# Patient Record
Sex: Female | Born: 1950
Health system: Southern US, Community
[De-identification: ages and names within clinical notes are randomized; demographics above are authoritative.]

## PROBLEM LIST (undated history)

## (undated) DIAGNOSIS — K5790 Diverticulosis of intestine, part unspecified, without perforation or abscess without bleeding: Secondary | ICD-10-CM

## (undated) DIAGNOSIS — T8859XA Other complications of anesthesia, initial encounter: Secondary | ICD-10-CM

## (undated) DIAGNOSIS — M1990 Unspecified osteoarthritis, unspecified site: Secondary | ICD-10-CM

## (undated) DIAGNOSIS — F32A Depression, unspecified: Secondary | ICD-10-CM

## (undated) DIAGNOSIS — E079 Disorder of thyroid, unspecified: Secondary | ICD-10-CM

## (undated) DIAGNOSIS — I10 Essential (primary) hypertension: Secondary | ICD-10-CM

## (undated) DIAGNOSIS — R7303 Prediabetes: Secondary | ICD-10-CM

## (undated) DIAGNOSIS — E039 Hypothyroidism, unspecified: Secondary | ICD-10-CM

## (undated) HISTORY — PX: KNEE ARTHROSCOPY: SUR90

## (undated) HISTORY — PX: TONSILLECTOMY: SUR1361

## (undated) HISTORY — PX: ABDOMINAL HYSTERECTOMY: SHX81

## (undated) HISTORY — PX: SHOULDER ARTHROSCOPY: SHX128

## (undated) HISTORY — PX: CYST EXCISION: SHX5701

---

## 1998-03-18 ENCOUNTER — Ambulatory Visit (HOSPITAL_COMMUNITY): Admission: RE | Admit: 1998-03-18 | Discharge: 1998-03-18 | Payer: Self-pay | Admitting: Obstetrics and Gynecology

## 1998-10-15 ENCOUNTER — Encounter: Admission: RE | Admit: 1998-10-15 | Discharge: 1998-11-04 | Payer: Self-pay | Admitting: Family Medicine

## 2000-06-01 ENCOUNTER — Encounter: Payer: Self-pay | Admitting: Obstetrics and Gynecology

## 2000-06-01 ENCOUNTER — Ambulatory Visit (HOSPITAL_COMMUNITY): Admission: RE | Admit: 2000-06-01 | Discharge: 2000-06-01 | Payer: Self-pay | Admitting: Obstetrics and Gynecology

## 2000-06-07 ENCOUNTER — Encounter: Admission: RE | Admit: 2000-06-07 | Discharge: 2000-06-07 | Payer: Self-pay | Admitting: Obstetrics and Gynecology

## 2000-06-07 ENCOUNTER — Encounter: Payer: Self-pay | Admitting: Obstetrics and Gynecology

## 2012-01-06 ENCOUNTER — Emergency Department (HOSPITAL_BASED_OUTPATIENT_CLINIC_OR_DEPARTMENT_OTHER)
Admission: EM | Admit: 2012-01-06 | Discharge: 2012-01-06 | Disposition: A | Discharge status: Home / Self Care | Payer: Self-pay | Attending: Emergency Medicine | Admitting: Emergency Medicine

## 2012-01-06 ENCOUNTER — Encounter (HOSPITAL_BASED_OUTPATIENT_CLINIC_OR_DEPARTMENT_OTHER): Payer: Self-pay | Admitting: *Deleted

## 2012-01-06 DIAGNOSIS — G5603 Carpal tunnel syndrome, bilateral upper limbs: Secondary | ICD-10-CM

## 2012-01-06 DIAGNOSIS — R209 Unspecified disturbances of skin sensation: Secondary | ICD-10-CM | POA: Insufficient documentation

## 2012-01-06 DIAGNOSIS — G5600 Carpal tunnel syndrome, unspecified upper limb: Secondary | ICD-10-CM | POA: Insufficient documentation

## 2012-01-06 DIAGNOSIS — M79609 Pain in unspecified limb: Secondary | ICD-10-CM | POA: Insufficient documentation

## 2012-01-06 HISTORY — DX: Essential (primary) hypertension: I10

## 2012-01-06 HISTORY — DX: Diverticulosis of intestine, part unspecified, without perforation or abscess without bleeding: K57.90

## 2012-01-06 HISTORY — DX: Disorder of thyroid, unspecified: E07.9

## 2012-01-06 MED ORDER — OXYCODONE-ACETAMINOPHEN 5-325 MG PO TABS: 2.0000 | ORAL_TABLET | ORAL | Status: AC | PRN

## 2012-01-06 NOTE — ED Notes (Signed)
Bilateral wrist splints applied.  Patient verbalized understanding of use, application and removal of the splints.

## 2012-01-06 NOTE — ED Notes (Signed)
Patient states she has bilateral hand pain and numbness for many years.  States her symptoms improved after being layed off from her job.  Has recently returned to work doing same type of job and for the last three days her hands have become more painful and numb, which is now waking her up at night.  Using ibuprofen with minimal relief.

## 2012-01-06 NOTE — Discharge Instructions (Signed)
Carpal Tunnel Syndrome Fact Sheet    You are working at your desk, trying to ignore the tingling or numbness you have had for months in your hand and wrist. Suddenly, a sharp, piercing pain shoots through the wrist and up your arm. Just a passing cramp? More likely you have carpal tunnel syndrome. This is a painful progressive condition caused by compression of a key nerve in the wrist.  WHAT IS CARPAL TUNNEL SYNDROME?  Carpal tunnel syndrome occurs when the nerve that runs from the forearm into the hand (median nerve), becomes pressed or squeezed at the wrist. The median nerve controls sensations to the palm side of the thumb and fingers (although not the little finger). That nerve also controls impulses to some small muscles in the hand, that allow the fingers and thumb to move. The narrow, rigid passageway of ligament and bones at the base of the hand (carpal tunnel) houses the median nerve and tendons. Sometimes, thickening from irritated tendons or other swelling narrows the tunnel. The narrowing causes the median nerve to be compressed. The result may be pain, weakness, or numbness in the hand and wrist that moves (radiates) up the arm. Although painful sensations may indicate other conditions, carpal tunnel syndrome is the most common and widely known of the entrapment neuropathies. These are conditions in which the body's peripheral nerves are compressed or traumatized.  SYMPTOMS   · Symptoms usually start gradually, with:   · Frequent burning.   · Tingling.   · Itching numbness in the palm of the hand and the fingers.   This is especially true in the thumb and the index and middle fingers.  · Some carpal tunnel sufferers say their fingers feel useless and swollen. They may feel this way even though little or no swelling is apparent.   · The symptoms often first appear in one or both hands during the night, since many people sleep with flexed wrists. A person with carpal tunnel syndrome may wake up feeling  the need to shake out the hand or wrist.   · As symptoms worsen, people might feel tingling during the day.   · Decreased grip strength may make it difficult to:   · Form a fist.   · Grasp small objects.   · Perform other manual tasks.   In chronic and/or untreated cases, the muscles at the base of the thumb may waste away.  · Some people are unable to tell between hot and cold by touch.   CAUSES   · Carpal tunnel syndrome is often the result of a combination of factors that increase pressure on the median nerve and tendons in the carpal tunnel. It is usually not a problem with the nerve itself.   · Most likely the disorder is due to a congenital predisposition. This means that the carpal tunnel is simply smaller in some people than in others.   · Other factors include:   · Trauma or injury to the wrist that cause swelling, such as sprain or fracture.   · Overactivity of the pituitary gland.   · Hypothyroidism.   · Rheumatoid arthritis.   · Mechanical problems in the wrist joint.   · Work stress.   · Repeated use of vibrating hand tools.   · Fluid retention during pregnancy or menopause.   · The development of a cyst or tumor in the canal.   · In some cases, no cause can be identified.   · There is little clinical   data to prove whether repetitive and forceful movements of the hand and wrist during work or leisure activities can cause carpal tunnel syndrome. Repeated motions performed in the course of normal work or other daily activities can result in repetitive motion disorders such as bursitis and tendonitis. Writer's cramp, a condition in which a lack of fine motor skill coordination and ache and pressure in the fingers, wrist, or forearm is brought on by repetitive activity, is not a symptom of carpal tunnel syndrome.   WHO IS AT RISK OF DEVELOPING CARPAL TUNNEL SYNDROME?  · Women are three times more likely than men to develop carpal tunnel syndrome. This may be the result of the carpal tunnel being smaller in  women.   · The dominant hand is usually affected first and produces the most severe pain.   · Diabetes or other metabolic disorders that directly affect the body's nerves make people more susceptible to compression.   · Carpal tunnel syndrome usually occurs only in adults.   · The risk of developing carpal tunnel syndrome is not confined to people in a single industry or job. However, it is especially common in those performing assembly line work, such as:   · Manufacturing.   · Sewing.   · Finishing.   · Cleaning.   · Meat packing.   Carpal tunnel syndrome is three times more common among assemblers than among data-entry personnel. A 2001 study by the [facility] found heavy computer use (up to 7 hours a day) did not increase a person's risk of developing carpal tunnel syndrome.  · During 1998, an estimated three of every 10,000 workers lost time from work because of carpal tunnel syndrome. Half of these workers missed more than 10 days of work. The average lifetime cost of carpal tunnel syndrome is estimated to be about $30,000 for each injured worker. This includes medical bills and lost time from work   DIAGNOSIS   · Early diagnosis and treatment are important to avoid permanent damage to the median nerve.   · A physical examination of the hands, arms, shoulders, and neck can help determine if the patient's complaints are related to daily activities or to an underlying disorder. An exam can rule out other painful conditions that mimic carpal tunnel syndrome.   · The wrist is examined for:   · Tenderness.   · Swelling.   · Warmth.   · Discoloration.   · Each finger should be tested for sensation. The muscles at the base of the hand should be examined for strength and signs of shrinking (atrophy).   · Routine laboratory tests and X-rays can reveal:   · Diabetes.   · Arthritis.   · Fractures.   · Physicians can use specific tests to try to produce the symptoms of carpal tunnel syndrome.   · In the Tinel test, the  caregiver taps or presses on the median nerve in the patient's wrist. The test is positive when tingling in the fingers or a shock-like sensation occurs.   · The Phalen or wrist-flexion test involves having the patient hold their forearms upright by pointing the fingers down and pressing the backs of the hands together. The presence of carpal tunnel syndrome is suggested if one or more symptoms is felt in the fingers within 1 minute.   · Caregivers may also ask patients to try to make a movement that brings on symptoms.   · Often it is necessary to confirm the diagnosis by use of   electrodiagnostic tests.   · In a nerve conduction study, electrodes are placed on the hand and wrist. Small electric shocks are applied and the speed with which nerves transmit impulses is measured.   · In electromyography, a fine needle is inserted into a muscle. The electrical activity is viewed on a screen. The activity can determine the severity of damage to the median nerve.   · Ultrasound imaging can show impaired movement of the median nerve.   TREATMENT   Treatments for carpal tunnel syndrome should begin as early as possible. It should be done under a caregiver's direction. Underlying causes such as diabetes or arthritis should be treated first. Initial treatment generally involves:  · Resting the affected hand and wrist for at least 2 weeks.   · Avoiding activities that may worsen symptoms.   · Immobilizing (keeping from moving) the wrist in a splint.   These things are all done to avoid further damage from twisting or bending. If there is inflammation, applying cool packs can help reduce swelling.  NON-SURGICAL  · Drugs - In special circumstances, different drugs can ease the pain and swelling.   · Nonsteroidal anti-inflammatory drugs, such as aspirin, and other non-prescription pain relievers, may ease symptoms.   · Orally administered diuretics (water pills) can decrease swelling.   · Corticosteroids such as prednisone or  lidocaine can be injected directly into the wrist or taken by mouth. This can relieve pressure on the median nerve. It may provide immediate, but temporary relief to persons with mild or intermittent symptoms. (Caution: persons with diabetes and those who may be predisposed to diabetes should note that long term use of corticosteroids can make it difficult to regulate insulin levels. Corticosterioids should not be taken without a prescription.)   · Additionally, some studies show that vitamin B6 (pyridoxine) supplements may ease the symptoms of carpal tunnel syndrome.   · Exercise - Stretching and strengthening exercises can be helpful in people whose symptoms have decreased. These exercises may be supervised by a physical therapist that is trained to use exercises to treat physical impairments. The exercises can also be supervised by an occupational therapist. This is someone who is trained in evaluating people with physical impairments. They will help them build skills to improve their health and well-being.   · Alternative therapies - Acupuncture and chiropractic care have helped some patients. Their effectiveness remains unproved. An exception is yoga, which has been shown to reduce pain and improve grip strength among patients with carpal tunnel syndrome.   SURGERY  Carpal tunnel release is a surgical procedure commonly used. It is generally recommended if symptoms last for 6 months or more. This surgery involves cutting the band of tissue around the wrist to reduce pressure on the median nerve. Surgery is done under local anesthesia. It does not require an overnight hospital stay. Many patients require surgery on both hands. The following are types of carpal tunnel release surgery:  · Open release surgery. This is the traditional procedure used to correct carpal tunnel syndrome. It consists of making a cut (incision) up to 2 inches in the wrist. Then, the surgeon cuts the carpal ligament to enlarge the carpal  tunnel. The procedure is generally done under local anesthesia on an outpatient basis. The only exception is if there are unusual medical considerations.   · Endoscopic surgery may allow faster recovery and less postoperative discomfort than traditional open release surgery. The surgeon makes two incisions (about 1/2" each) in the wrist and   palm. The surgeon will:   · Insert a camera attached to a tube.   · Observe the tissue on a screen.   · Cut the carpal ligament (the tissue that holds joints together).   This two-portal endoscopic surgery, generally performed under local anesthesia, is effective and minimizes scarring and scar tenderness. One-portal endoscopic surgery for carpal tunnel syndrome is also available.  Although symptoms may be relieved immediately after surgery, full recovery from carpal tunnel surgery can take months. Some patients may have:  · Infection.   · Nerve damage.   · Stiffness.   · Pain at the scar.   Occasionally, the wrist loses strength because the carpal ligament is cut. Patients should undergo physical therapy after surgery to restore wrist strength. Some patients may need to adjust job duties or even change jobs after recovery from surgery.  Recurrence of carpal tunnel syndrome following treatment is rare. The majority of patients recover completely.  PREVENTION   At the workplace, workers can:   · Do on-the-job conditioning, perform stretching exercises.   · Take frequent rest breaks.   · Wear splints to keep wrists straight.   · Use correct posture and wrist position.   · Wearing finger-less gloves can help keep hands warm and flexible.   · Design workstations, tools and tool handles, and tasks to enable the worker's wrist to maintain a natural position.   · Rotate jobs among workers.   · Employers can develop programs in ergonomics. This is the process of adapting workplace conditions and job demands to the capabilities of workers. However, research has not conclusively shown that  these workplace changes prevent the occurrence of carpal tunnel syndrome.   WHAT RESEARCH IS BEING DONE?  The National Institute of Neurological Disorders and Stroke (NINDS), a part of the National Institutes of Health, is the federal government's leading supporter of research on carpal tunnel syndrome. Scientists are studying the order of events that occur with carpal tunnel syndrome. They do this to better understand, treat, and prevent this ailment.   WHAT IS PERCUTANEOUS BALLOON CARPAL TUNNEL-PLASTY?  Percutaneous balloon carpal tunnel-plasty is an experimental technique that can ease carpal tunnel pain without cutting the carpal ligament. In this procedure, a 1/4 -inch cut is made at the base of the palm. The caregiver then inserts a balloon through a catheter under the carpal ligament and inflates the balloon to stretch the ligament and free the nerve. Patients in one small study of this procedure reported relief of symptoms with no complications after the surgery. Most of them were back to work within 2 weeks. This experimental technique is not yet widely available.  FOR MORE INFORMATION  American Academy of Orthopaedic Surgeons: www.aaos.org   Centers for Disease Control and Prevention (CDCP): www.cdc.gov  National Institute of Arthritis and Musculoskeletal and Skin Diseases (NIAMS): www.niams.nih.gov  American Chronic Pain Association (ACPA): www.theacpa.org  National Chronic Pain Outreach Association (NCPOA): www.chronicpain.org  Occupational Safety & Health Administration: www.osha.gov  Document Released: 06/01/2004 Document Revised: 06/30/2011 Document Reviewed: 06/05/2008  [employer]® Patient Information ©2012 [employer].

## 2012-01-06 NOTE — ED Provider Notes (Signed)
History     CSN: [id number]  Arrival date & time 01/06/12  1036   First MD Initiated Contact with Patient 01/06/12 1057      Chief Complaint  Patient presents with  . Hand Pain    bilateral hand numbness    (Consider location/radiation/quality/duration/timing/severity/associated sxs/prior treatment) HPI Patient with bilateral hand pain for four years.  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  Pain in bilateral hands with some tingling to left third finger.  Pain awakens at night and patient shakes hands to relieve.  No injury, redness, swelling, proximal adenopathy, or neck pain.   No past medical history on file.  No past surgical history on file.  No family history on file.  History  Substance Use Topics  . Smoking status: Not on file  . Smokeless tobacco: Not on file  . Alcohol Use: Not on file    OB History    No data available      Review of Systems  All other systems reviewed and are negative.    Allergies  Review of patient's allergies indicates not on file.  Home Medications  No current outpatient prescriptions on file.  BP 134/88  Pulse 82  Temp(Src) 98.1 F (36.7 C) (Oral)  Ht 5\' 5"  (1.651 m)  Wt 180 lb (81.647 kg)  BMI 29.95 kg/m2  SpO2 99%  Physical Exam  Vitals reviewed. Constitutional: She is oriented to person, place, and time. She appears well-developed and well-nourished.  HENT:  Head: Normocephalic and atraumatic.  Eyes: Conjunctivae are normal. Pupils are equal, round, and reactive to light.  Neck: Normal range of motion. Neck supple.  Cardiovascular: Normal rate and regular rhythm.   Pulmonary/Chest: Effort normal.  Abdominal: Soft.  Musculoskeletal:       Right wrist: She exhibits normal range of motion, no tenderness, no bony tenderness, no swelling, no effusion, no crepitus, no deformity and no laceration.       Left wrist: She exhibits normal range of motion, no tenderness, no bony  tenderness, no swelling, no effusion, no crepitus, no deformity and no laceration.       Bilateral tinel's test positive, left phalen maneuver positive  Neurological: She is alert and oriented to person, place, and time. She has normal reflexes.  Skin: Skin is warm and dry.  Psychiatric: She has a normal mood and affect.    ED Course  Procedures (including critical care time)  Labs Reviewed - No data to display No results found.   No diagnosis found.    MDM    Patient symptoms consistent with carpal tunnel syndrome. Plan splints and followup.    Hilario Quarry, MD 01/06/12 1118

## 2012-01-18 ENCOUNTER — Encounter: Payer: Self-pay | Admitting: Family Medicine

## 2012-01-18 ENCOUNTER — Ambulatory Visit (INDEPENDENT_AMBULATORY_CARE_PROVIDER_SITE_OTHER): Payer: Self-pay | Admitting: Family Medicine

## 2012-01-18 VITALS — BP 158/80 | HR 81 | Temp 98.1°F | Ht 66.0 in | Wt 180.0 lb

## 2012-01-18 DIAGNOSIS — G5600 Carpal tunnel syndrome, unspecified upper limb: Secondary | ICD-10-CM

## 2012-01-18 DIAGNOSIS — G5603 Carpal tunnel syndrome, bilateral upper limbs: Secondary | ICD-10-CM

## 2012-01-18 NOTE — Patient Instructions (Signed)
You have carpal tunnel syndrome. Wear the wrist splint at nighttime and as often as possible during the day Take ibuprofen (3 tablets three times a day with food) OR aleve (2 tablets twice a day with food) for pain and inflammation. Corticosteroid injection is a consideration to help with pain and inflammation - you were given this today. If not improving, will consider nerve conduction studies to assess severity. Surgery tends to work well for this if you do not improve with conservative care. Expect pain to go away first, then weakness, then numbness. You can repeat the injection again if you only get some relief. Follow up with me in 1 month for a recheck.

## 2012-01-19 ENCOUNTER — Encounter: Payer: Self-pay | Admitting: Family Medicine

## 2012-01-19 DIAGNOSIS — G5603 Carpal tunnel syndrome, bilateral upper limbs: Secondary | ICD-10-CM | POA: Insufficient documentation

## 2012-01-19 NOTE — Progress Notes (Signed)
Subjective:    Patient ID: Monica Mcintyre, female    DOB: [birth year], 61 y.o.   MRN: [id number]  PCP: None  HPI 61 yo F here with L > R wrist pain.  Patient denies known injury. 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 Unable to sleep due to pain. 1st-3rd digits on left hand numb. Also with cramps in to L > R hands She is right handed. Using wrist braces at nighttime which do help but still having a lot of pain, numbness left hand.  Past Medical History  Diagnosis Date  . Diverticulosis   . Hypertension   . Thyroid disease     Current Outpatient Prescriptions on File Prior to Visit  Medication Sig Dispense Refill  . ibuprofen (ADVIL,MOTRIN) 600 MG tablet Take 600 mg by mouth every 8 (eight) hours as needed.      Marland Kitchen levothyroxine (SYNTHROID, LEVOTHROID) 75 MCG tablet Take 75 mcg by mouth daily.        Past Surgical History  Procedure Date  . Knee arthroscopy   . Abdominal hysterectomy   . Tonsillectomy     Allergies  Allergen Reactions  . Codeine Nausea And Vomiting    History   Social History  . Marital Status: Unknown    Spouse Name: N/A    Number of Children: N/A  . Years of Education: N/A   Occupational History  . Not on file.   Social History Main Topics  . Smoking status: Never Smoker   . Smokeless tobacco: Not on file  . Alcohol Use: No  . Drug Use: No  . Sexually Active: Not on file   Other Topics Concern  . Not on file   Social History Narrative  . No narrative on file    Family History  Problem Relation Age of Onset  . Hyperlipidemia Mother   . Hypertension Mother   . Diabetes Neg Hx   . Heart attack Neg Hx   . Sudden death Neg Hx     BP 158/80  Pulse 81  Temp(Src) 98.1 F (36.7 C) (Oral)  Ht 5\' 6"   (1.676 m)  Wt 180 lb (81.647 kg)  BMI 29.05 kg/m2  Review of Systems See HPI above.    Objective:   Physical Exam Gen: NAD  Left wrist: No gross deformity, swelling, bruising.  No thenar/hypothenar atrophy. TTP volar wrist over carpal tunnel.  No other TTP about wrist, hand. FROM wrist and digits. Strength 5/5 with finger abduction, thumb opposition, finger extension. + tinels and phalens. Sensation diminished 1st-3rd digits. Cap refill < 3 sec all digits.  Right wrist: No gross deformity, swelling, bruising.  No thenar/hypothenar atrophy. Minimal TTP volar wrist over carpal tunnel.  No other TTP about wrist, hand. FROM wrist and digits. Strength 5/5 with finger abduction, thumb opposition, finger extension. Mild + tinels, negative phalens. Sensation intact all digits. Cap refill < 3 sec all digits.    Assessment & Plan:  1. Bilateral carpal tunnel - left side very symptomatic - she would like to go ahead with carpal tunnel injection today.  Continue with cockup wrist splints bilaterally.  Discussed nsaids, avoiding painful activities.  Consider repeat injection vs NCV/EMG if not improving as expected.  See instructions for further.  After informed written consent patient  was seated on exam table.  Left wrist prepped with alcohol swab after U/s identified median nerve (0.22cm volume) - prepped ulnar to this.  Carpal tunnel then injected with 2:1 marcaine: depomedrol.  Patient tolerated procedure well without any immediate complications.

## 2012-01-19 NOTE — Assessment & Plan Note (Signed)
left side very symptomatic - she would like to go ahead with carpal tunnel injection today.  Continue with cockup wrist splints bilaterally.  Discussed nsaids, avoiding painful activities.  Consider repeat injection vs NCV/EMG if not improving as expected.  See instructions for further.  After informed written consent patient was seated on exam table.  Left wrist prepped with alcohol swab after U/s identified median nerve (0.22cm volume) - prepped ulnar to this.  Carpal tunnel then injected with 2:1 marcaine: depomedrol.  Patient tolerated procedure well without any immediate complications.

## 2012-02-21 ENCOUNTER — Ambulatory Visit (INDEPENDENT_AMBULATORY_CARE_PROVIDER_SITE_OTHER): Payer: Self-pay | Admitting: Family Medicine

## 2012-02-21 ENCOUNTER — Encounter: Payer: Self-pay | Admitting: Family Medicine

## 2012-02-21 VITALS — BP 144/82 | HR 73 | Temp 97.7°F | Ht 65.0 in | Wt 180.0 lb

## 2012-02-21 DIAGNOSIS — G5600 Carpal tunnel syndrome, unspecified upper limb: Secondary | ICD-10-CM

## 2012-02-21 DIAGNOSIS — G5603 Carpal tunnel syndrome, bilateral upper limbs: Secondary | ICD-10-CM

## 2012-02-21 DIAGNOSIS — G5602 Carpal tunnel syndrome, left upper limb: Secondary | ICD-10-CM

## 2012-02-21 NOTE — Patient Instructions (Signed)
We will go ahead with nerve conduction velocities of your left arm for carpal tunnel and I will call you when I get these results. Continue wearing the wrist braces at night and as much as you can during the day. Avoid icing these areas as it can numb the nerves more. Take vitamin B6 100mg  daily - this may help with nerve pain and healing.

## 2012-02-22 ENCOUNTER — Encounter: Payer: Self-pay | Admitting: Family Medicine

## 2012-02-22 NOTE — Progress Notes (Signed)
Subjective:    Patient ID: Monica Mcintyre, female    DOB: 08-10-1951, 61 y.o.   MRN: [id number]  PCP: None  HPI  61 yo F here for f/u L > R wrist pain.  3/19: Patient denies known injury. 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 Unable to sleep due to pain. 1st-3rd digits on left hand numb. Also with cramps in to L > R hands She is right handed. Using wrist braces at nighttime which do help but still having a lot of pain, numbness left hand.  4/22: Patient reports she's had only mild improvement since last visit. Right wrist and hand not really bothering her (only once since last visit per her report). But left hand/wrist still very symptomatic. Doesn't feel she has improvement with cortisone injection. Left hand feels more weak, hot water bothers it. Worse during the day. Compliant with nighttime splinting.  Past Medical History  Diagnosis Date  . Diverticulosis   . Hypertension   . Thyroid disease     Current Outpatient Prescriptions on File Prior to Visit  Medication Sig Dispense Refill  . ibuprofen (ADVIL,MOTRIN) 600 MG tablet Take 600 mg by mouth every 8 (eight) hours as needed.      Marland Kitchen levothyroxine (SYNTHROID, LEVOTHROID) 75 MCG tablet Take 75 mcg by mouth daily.        Past Surgical History  Procedure Date  . Knee arthroscopy   . Abdominal hysterectomy   . Tonsillectomy     Allergies  Allergen Reactions  . Codeine Nausea And Vomiting    History   Social History  . Marital Status: Unknown    Spouse Name: N/A    Number of Children: N/A  . Years of Education: N/A   Occupational History  . Not on file.   Social History Main Topics  . Smoking status: Never Smoker   . Smokeless tobacco: Not on file  . Alcohol Use: No    . Drug Use: No  . Sexually Active: Not on file   Other Topics Concern  . Not on file   Social History Narrative  . No narrative on file    Family History  Problem Relation Age of Onset  . Hyperlipidemia Mother   . Hypertension Mother   . Diabetes Neg Hx   . Heart attack Neg Hx   . Sudden death Neg Hx     BP 144/82  Pulse 73  Temp(Src) 97.7 F (36.5 C) (Oral)  Ht 5\' 5"  (1.651 m)  Wt 180 lb (81.647 kg)  BMI 29.95 kg/m2  Review of Systems  See HPI above.    Objective:   Physical Exam  Gen: NAD  Left wrist: No gross deformity, swelling, bruising.  No thenar/hypothenar atrophy. Mild TTP volar wrist over carpal tunnel.  No other TTP about wrist, hand. FROM wrist and digits. Strength 5-/5 with finger abduction, thumb opposition. 5/5 finger extension. + tinels and phalens. Sensation diminished 1st-3rd digits. Cap refill < 3 sec all digits.  Right wrist: No gross deformity, swelling, bruising.  No thenar/hypothenar atrophy. No TTP volar wrist over carpal tunnel.  No other TTP about wrist, hand. FROM wrist and digits. Strength 5/5 with finger abduction, thumb opposition, finger extension. Negative tinels, negative phalens. Sensation intact all digits. Cap refill < 3  sec all digits.    Assessment & Plan:  1. Bilateral carpal tunnel - left side very symptomatic - not improved with cortisone injection and splints.  Will go ahead with NCV/EMG of left upper extremity to further assess.  Right side is improved for the most part - continue with splinting.  Will call her with results of NCV/EMG and how to proceed after this.

## 2012-02-22 NOTE — Assessment & Plan Note (Signed)
left side very symptomatic - not improved with cortisone injection and splints.  Will go ahead with NCV/EMG of left upper extremity to further assess.  Right side is improved for the most part - continue with splinting.  Will call her with results of NCV/EMG and how to proceed after this.

## 2018-02-27 DIAGNOSIS — M1990 Unspecified osteoarthritis, unspecified site: Secondary | ICD-10-CM | POA: Diagnosis not present

## 2018-02-27 DIAGNOSIS — E039 Hypothyroidism, unspecified: Secondary | ICD-10-CM | POA: Diagnosis not present

## 2018-02-27 DIAGNOSIS — I10 Essential (primary) hypertension: Secondary | ICD-10-CM | POA: Diagnosis not present

## 2018-02-27 DIAGNOSIS — J309 Allergic rhinitis, unspecified: Secondary | ICD-10-CM | POA: Diagnosis not present

## 2018-02-27 DIAGNOSIS — E559 Vitamin D deficiency, unspecified: Secondary | ICD-10-CM | POA: Diagnosis not present

## 2018-02-27 DIAGNOSIS — R739 Hyperglycemia, unspecified: Secondary | ICD-10-CM | POA: Diagnosis not present

## 2018-03-06 ENCOUNTER — Other Ambulatory Visit: Payer: Self-pay | Admitting: Internal Medicine

## 2018-03-06 DIAGNOSIS — Z6379 Other stressful life events affecting family and household: Secondary | ICD-10-CM | POA: Diagnosis not present

## 2018-03-06 DIAGNOSIS — H539 Unspecified visual disturbance: Secondary | ICD-10-CM | POA: Diagnosis not present

## 2018-03-06 DIAGNOSIS — R42 Dizziness and giddiness: Secondary | ICD-10-CM

## 2018-03-06 DIAGNOSIS — I10 Essential (primary) hypertension: Secondary | ICD-10-CM | POA: Diagnosis not present

## 2018-03-06 DIAGNOSIS — E039 Hypothyroidism, unspecified: Secondary | ICD-10-CM | POA: Diagnosis not present

## 2018-03-06 DIAGNOSIS — R7303 Prediabetes: Secondary | ICD-10-CM | POA: Diagnosis not present

## 2018-03-08 ENCOUNTER — Ambulatory Visit
Admission: RE | Admit: 2018-03-08 | Discharge: 2018-03-08 | Disposition: A | Discharge status: Home / Self Care | Payer: PPO | Source: Ambulatory Visit | Attending: Internal Medicine | Admitting: Internal Medicine

## 2018-03-08 DIAGNOSIS — R42 Dizziness and giddiness: Secondary | ICD-10-CM

## 2018-03-08 DIAGNOSIS — H539 Unspecified visual disturbance: Secondary | ICD-10-CM

## 2018-03-08 MED ORDER — IOPAMIDOL (ISOVUE-300) INJECTION 61%
75.0000 mL | Freq: Once | INTRAVENOUS | Status: AC | PRN
  Administered 2018-03-08: 75 mL via INTRAVENOUS

## 2018-03-09 DIAGNOSIS — L309 Dermatitis, unspecified: Secondary | ICD-10-CM | POA: Diagnosis not present

## 2018-03-09 DIAGNOSIS — R7303 Prediabetes: Secondary | ICD-10-CM | POA: Diagnosis not present

## 2018-03-09 DIAGNOSIS — R42 Dizziness and giddiness: Secondary | ICD-10-CM | POA: Diagnosis not present

## 2018-03-09 DIAGNOSIS — I10 Essential (primary) hypertension: Secondary | ICD-10-CM | POA: Diagnosis not present

## 2018-03-09 DIAGNOSIS — M1990 Unspecified osteoarthritis, unspecified site: Secondary | ICD-10-CM | POA: Diagnosis not present

## 2018-04-21 ENCOUNTER — Ambulatory Visit (HOSPITAL_BASED_OUTPATIENT_CLINIC_OR_DEPARTMENT_OTHER): Payer: PPO | Admitting: Registered"

## 2018-04-21 ENCOUNTER — Encounter: Payer: Self-pay | Admitting: Registered"

## 2018-04-21 DIAGNOSIS — R7303 Prediabetes: Secondary | ICD-10-CM | POA: Diagnosis not present

## 2018-04-21 NOTE — Progress Notes (Signed)
Patient was seen on 04/21/18 for the Core Session 1 of Diabetes Prevention Program course at [facility]. The following learning objectives were met by the patient during this class:   Learning Objectives:   Be able to explain the purpose and benefits of the National Diabetes Prevention Program.   Be able to describe the events that will take place at every session.   Know the weight loss and physical activity goals established by the [facility] Diabetes Prevention Program.   Know their own individual weight loss and physical activity goals.   Be able to explain the important effect of self-monitoring on behavior change.   Goals:  Record food and beverage intake in "Food and Activity Tracker" over the next week.  Bring completed "Food and Activity Tracker" for session 1 to session 2 next week. Circle the foods or beverages you think are highest in fat and calories in your food tracker. Read the labels on the food you buy, and consider using measuring cups and spoons to help you calculate the amount you eat. We will talk about measuring in more detail in the coming weeks.   Follow-Up Plan:  Attend Core Session 2 next week.   Bring completed "Food and Activity Tracker" next week to be reviewed by Lifestyle Coach.

## 2018-04-28 ENCOUNTER — Encounter: Payer: PPO | Attending: Internal Medicine | Admitting: Registered"

## 2018-04-28 ENCOUNTER — Encounter: Payer: Self-pay | Admitting: Registered"

## 2018-04-28 DIAGNOSIS — R7303 Prediabetes: Secondary | ICD-10-CM

## 2018-04-28 NOTE — Progress Notes (Signed)
Patient was seen on 04/28/18 for the Core Session 2 of Diabetes Prevention Program course at [facility]. By the end of this session patients are able to complete the following objectives:   Learning Objectives:  Self-monitor their weight during the weeks following Session 2.   Describe the relationship between fat and calories.   Explain the reason for, and basic principles of, self-monitoring fat grams and calories.   Identify their personal fat gram goals.   Use the ?Fat and Calorie Counter? to calculate the calories and fat grams of a given selection of foods.   Keep a running total of the fat grams they eat each day.   Calculate fat, calories, and serving sizes from nutrition labels.   Goals:   Weigh yourself at the same time each day, or every few days, and record your weight in your Food and Activity Tracker.  Write down everything you eat and drink in your Food and Activity Tracker.  Measure portions as much as you can, and start reading labels.   Use the ?Fat and Calorie Counter? to figure out the amount of fat and calories in what you ate, and write the amount down in your Food and Activity Tracker.  Keep a running fat gram total throughout the day. Come as close to your fat gram goal as you can.   Follow-Up Plan:  Attend Core Session 3 next week.   Bring completed "Food and Activity Tracker" next week to be reviewed by Lifestyle Coach.

## 2018-05-12 ENCOUNTER — Encounter: Payer: PPO | Attending: Internal Medicine | Admitting: Registered"

## 2018-05-19 ENCOUNTER — Encounter (HOSPITAL_BASED_OUTPATIENT_CLINIC_OR_DEPARTMENT_OTHER): Payer: PPO | Admitting: Registered"

## 2018-05-19 ENCOUNTER — Encounter: Payer: Self-pay | Admitting: Registered"

## 2018-05-19 DIAGNOSIS — R7303 Prediabetes: Secondary | ICD-10-CM | POA: Diagnosis not present

## 2018-05-19 NOTE — Progress Notes (Signed)
Patient was seen on 05/19/18 for the Core Session 4 of Diabetes Prevention Program course at [facility]. By the end of this session patients are able to complete the following objectives:   Learning Objectives:  Describe the MyPlate food guide and its recommendations, including how to reduce fat and calories in our diet.  Compare and contrast MyPlate guidelines with participants' eating habits.  List ways to replace high-fat and high-calorie foods with low-fat and low-calorie foods.  Explain the importance of eating plenty of whole grains, vegetables, and fruits, while staying within fat gram goals.  Explain the importance of eating foods from all groups of MyPlate and of eating a variety of foods from within each group.  Explain why a balanced diet is beneficial to health.  Explain why eating the same foods over and over is not the best strategy for long-term success.   Goals:   Record weight taken outside of class.   Track foods and beverages eaten each day in the "Food and Activity Tracker," including calories and fat grams for each item.   Practice comparing what you eat with the recommendations of MyPlate using the "Rate Your Plate" handout.   Complete the "Rate Your Plate" handout form on at least 3 days.   Answer homework questions.   Follow-Up Plan:  Attend Core Session 5 next week.   Bring completed "Food and Activity Tracker" next week to be reviewed by Lifestyle Coach.

## 2018-05-25 ENCOUNTER — Encounter: Payer: Self-pay | Admitting: Registered"

## 2018-05-25 ENCOUNTER — Ambulatory Visit (HOSPITAL_BASED_OUTPATIENT_CLINIC_OR_DEPARTMENT_OTHER): Payer: PPO | Admitting: Registered"

## 2018-05-25 DIAGNOSIS — R7303 Prediabetes: Secondary | ICD-10-CM | POA: Diagnosis not present

## 2018-05-25 NOTE — Progress Notes (Signed)
On 05/25/18 patient completed Core Session 3 of Diabetes Prevention Program course. By the end of this session patients are able to complete the following objectives:   Learning Objectives:  Weigh and measure foods.  Estimate the fat and calorie content of common foods.  Describe three ways to eat less fat and fewer calories.  Create a plan to eat less fat for the following week.   Goals:   Track weight when weighing outside of class.   Track food and beverages eaten each day in Food and Activity Tracker and include fat grams and calories for each.   Try to stay within fat gram goal.   Complete plan for eating less high fat foods and answer related homework questions.    Follow-Up Plan:  Attend next Core Session.   Bring completed "Food and Activity Tracker" next week to be reviewed by Lifestyle Coach.

## 2018-05-26 ENCOUNTER — Encounter: Payer: Self-pay | Admitting: Registered"

## 2018-05-26 ENCOUNTER — Encounter (HOSPITAL_BASED_OUTPATIENT_CLINIC_OR_DEPARTMENT_OTHER): Payer: PPO | Admitting: Registered"

## 2018-05-26 DIAGNOSIS — R7303 Prediabetes: Secondary | ICD-10-CM | POA: Diagnosis not present

## 2018-05-26 NOTE — Progress Notes (Signed)
Patient was seen on 05/26/18 for the Core Session 5 of Diabetes Prevention Program course at [facility]. By the end of this session patients are able to complete the following objectives:   Learning Objectives:  Establish a physical activity goal.  Explain the importance of the physical activity goal.  Describe their current level of physical activity.  Name ways that they are already physically active.  Develop personal plans for physical activity for the next week.   Goals:   Record weight taken outside of class.   Track foods and beverages eaten each day in the "Food and Activity Tracker," including calories and fat grams for each item.   Make an Activity Plan including date, specific type of activity, and length of time you plan to be active that includes at last 60 minutes of activity for the week.   Track activity type, minutes you were active, and distance you reached each day in the "Food and Activity Tracker."   Follow-Up Plan:  Attend Core Session 6 next week.   Bring completed "Food and Activity Tracker" next week to be reviewed by Lifestyle Coach.

## 2018-05-31 DIAGNOSIS — M1990 Unspecified osteoarthritis, unspecified site: Secondary | ICD-10-CM | POA: Diagnosis not present

## 2018-05-31 DIAGNOSIS — R7303 Prediabetes: Secondary | ICD-10-CM | POA: Diagnosis not present

## 2018-05-31 DIAGNOSIS — F419 Anxiety disorder, unspecified: Secondary | ICD-10-CM | POA: Diagnosis not present

## 2018-05-31 DIAGNOSIS — I10 Essential (primary) hypertension: Secondary | ICD-10-CM | POA: Diagnosis not present

## 2018-05-31 DIAGNOSIS — R634 Abnormal weight loss: Secondary | ICD-10-CM | POA: Diagnosis not present

## 2018-06-02 ENCOUNTER — Encounter: Payer: PPO | Admitting: Registered"

## 2018-06-09 ENCOUNTER — Encounter: Payer: PPO | Attending: Internal Medicine | Admitting: Registered"

## 2018-06-09 ENCOUNTER — Encounter: Payer: Self-pay | Admitting: Registered"

## 2018-06-09 DIAGNOSIS — R7303 Prediabetes: Secondary | ICD-10-CM | POA: Diagnosis not present

## 2018-06-09 NOTE — Progress Notes (Signed)
Patient was seen on 06/09/18 for the Core Session 7 of Diabetes Prevention Program course at [facility]. By the end of this session patients are able to complete the following objectives:   Learning Objectives:  Define calorie balance.  Explain how healthy eating and being active are related in terms of calorie balance.   Describe the relationship between calorie balance and weight loss.   Describe his or her progress as it relates to calorie balance.   Develop an activity plan for the coming week.   Goals:   Record weight taken outside of class.   Track foods and beverages eaten each day in the "Food and Activity Tracker," including calories and fat grams for each item.    Track activity type, minutes you were active, and distance you reached each day in the "Food and Activity Tracker."   Set aside one 20 to 30-minute block of time every day or find two or more periods of 10 to15 minutes each for physical activity.   Make a Physical Activities Plan for the Week.   Make active lifestyle choices all through the day   Stay at or go slightly over activity goal.   Follow-Up Plan:  Attend Core Session 8 next week.   Bring completed "Food and Activity Tracker" next week to be reviewed by Lifestyle Coach.

## 2018-06-13 DIAGNOSIS — H2513 Age-related nuclear cataract, bilateral: Secondary | ICD-10-CM | POA: Diagnosis not present

## 2018-06-13 DIAGNOSIS — H5203 Hypermetropia, bilateral: Secondary | ICD-10-CM | POA: Diagnosis not present

## 2018-06-13 DIAGNOSIS — H40019 Open angle with borderline findings, low risk, unspecified eye: Secondary | ICD-10-CM | POA: Diagnosis not present

## 2018-06-13 DIAGNOSIS — H524 Presbyopia: Secondary | ICD-10-CM | POA: Diagnosis not present

## 2018-06-13 DIAGNOSIS — H25013 Cortical age-related cataract, bilateral: Secondary | ICD-10-CM | POA: Diagnosis not present

## 2018-06-16 ENCOUNTER — Encounter (HOSPITAL_BASED_OUTPATIENT_CLINIC_OR_DEPARTMENT_OTHER): Payer: PPO | Admitting: Registered"

## 2018-06-16 ENCOUNTER — Encounter: Payer: Self-pay | Admitting: Registered"

## 2018-06-16 DIAGNOSIS — R7303 Prediabetes: Secondary | ICD-10-CM

## 2018-06-16 NOTE — Progress Notes (Signed)
Patient was seen on 06/16/18 for the Core Session 8 of Diabetes Prevention Program course at [facility]. By the end of this session patients are able to complete the following objectives:   Learning Objectives:  Recognize positive and negative food and activity cues.   Change negative food and activity cues to positive cues.   Add positive cues for activity and eliminate cues for inactivity.   Develop a plan for removing one problem food cue for the coming week.   Goals:   Record weight taken outside of class.   Track foods and beverages eaten each day in the "Food and Activity Tracker," including calories and fat grams for each item.    Track activity type, minutes you were active, and distance you reached each day in the "Food and Activity Tracker."   Set aside one 20 to 30-minute block of time every day or find two or more periods of 10 to15 minutes each for physical activity.   Remove one problem food cue.   Add one positive cue for being more active.  Follow-Up Plan:  Attend Core Session 9 next week.   Bring completed "Food and Activity Tracker" next week to be reviewed by Lifestyle Coach.

## 2018-06-21 DIAGNOSIS — D126 Benign neoplasm of colon, unspecified: Secondary | ICD-10-CM | POA: Diagnosis not present

## 2018-06-21 DIAGNOSIS — Z1211 Encounter for screening for malignant neoplasm of colon: Secondary | ICD-10-CM | POA: Diagnosis not present

## 2018-06-21 DIAGNOSIS — K5730 Diverticulosis of large intestine without perforation or abscess without bleeding: Secondary | ICD-10-CM | POA: Diagnosis not present

## 2018-06-21 DIAGNOSIS — K635 Polyp of colon: Secondary | ICD-10-CM | POA: Diagnosis not present

## 2018-06-23 ENCOUNTER — Encounter (HOSPITAL_BASED_OUTPATIENT_CLINIC_OR_DEPARTMENT_OTHER): Payer: PPO | Admitting: Registered"

## 2018-06-23 ENCOUNTER — Encounter: Payer: Self-pay | Admitting: Registered"

## 2018-06-23 DIAGNOSIS — R7303 Prediabetes: Secondary | ICD-10-CM | POA: Diagnosis not present

## 2018-06-23 DIAGNOSIS — K635 Polyp of colon: Secondary | ICD-10-CM | POA: Diagnosis not present

## 2018-06-23 DIAGNOSIS — D126 Benign neoplasm of colon, unspecified: Secondary | ICD-10-CM | POA: Diagnosis not present

## 2018-06-23 NOTE — Progress Notes (Signed)
Patient was seen on 06/23/18 for the Core Session 9 of Diabetes Prevention Program course at [facility]. By the end of this session patients are able to complete the following objectives:   Learning Objectives:  List and describe five steps to problem solving.   Apply the five problem solving steps to resolve a problem he or she has with eating less fat and fewer calories or being more active.   Goals:   Record weight taken outside of class.   Track foods and beverages eaten each day in the "Food and Activity Tracker," including calories and fat grams for each item.    Track activity type, minutes you were active, and distance you reached each day in the "Food and Activity Tracker."   Set aside one 20 to 30-minute block of time every day or find two or more periods of 10 to15 minutes each for physical activity.   Use problem solving action plan created during session to problem solve.   Follow-Up Plan:  Attend Core Session 10 next week.   Bring completed "Food and Activity Tracker" next week to be reviewed by Lifestyle Coach.  Bring menus from favorite restaurants to next session for future discussion.

## 2018-06-27 DIAGNOSIS — H40013 Open angle with borderline findings, low risk, bilateral: Secondary | ICD-10-CM | POA: Diagnosis not present

## 2018-06-30 ENCOUNTER — Encounter: Payer: Self-pay | Admitting: Registered"

## 2018-06-30 ENCOUNTER — Ambulatory Visit (HOSPITAL_BASED_OUTPATIENT_CLINIC_OR_DEPARTMENT_OTHER): Payer: PPO | Admitting: Registered"

## 2018-06-30 DIAGNOSIS — R7303 Prediabetes: Secondary | ICD-10-CM

## 2018-06-30 NOTE — Progress Notes (Signed)
Patient was seen on 06/30/18 for the Core Session 10 of Diabetes Prevention Program course at [facility]. By the end of this session patients are able to complete the following objectives:   Learning Objectives:  List and describe the four keys for healthy eating out.   Give examples of how to apply these keys at the type of restaurants that the participants go to regularly.   Make an appropriate meal selection from a restaurant menu.   Demonstrate how to ask for a substitute item using assertive language and a polite tone of voice.    Goals:   Record weight taken outside of class.   Track foods and beverages eaten each day in the "Food and Activity Tracker," including calories and fat grams for each item.    Track activity type, minutes you were active, and distance you reached each day in the "Food and Activity Tracker."   Set aside one 20 to 30-minute block of time every day or find two or more periods of 10 to15 minutes each for physical activity.   Utilize positive action plan and complete questions on "To Do List."   Follow-Up Plan:  Attend Core Session 11 next week.   Bring completed "Food and Activity Tracker" next week to be reviewed by Lifestyle Coach.

## 2018-07-07 ENCOUNTER — Encounter: Payer: Self-pay | Admitting: Registered"

## 2018-07-07 ENCOUNTER — Encounter: Payer: PPO | Attending: Internal Medicine | Admitting: Registered"

## 2018-07-07 DIAGNOSIS — R7303 Prediabetes: Secondary | ICD-10-CM

## 2018-07-07 NOTE — Progress Notes (Signed)
Patient was seen on 07/07/18 for the Core Session 11 of Diabetes Prevention Program course at [facility]. By the end of this session patients are able to complete the following objectives:   Learning Objectives:  Give examples of negative thoughts that could prevent them from meeting their goals of losing weight and being more physically active.   Describe how to stop negative thoughts and talk back to them with positive thoughts.   Practice 1) stopping negative thoughts and 2) talking back to negative thoughts with positive ones.    Goals:   Record weight taken outside of class.   Track foods and beverages eaten each day in the "Food and Activity Tracker," including calories and fat grams for each item.    Track activity type, minutes you were active, and distance you reached each day in the "Food and Activity Tracker."   If you have any negative thoughts-write them in your Food and Activity Trackers, along with how you talked back to them. Practice stopping negative thoughts and talking back to them with positive thoughts.   Follow-Up Plan:  Attend Core Session 12.   Bring completed "Food and Activity Tracker" next week to be reviewed by Lifestyle Coach.

## 2018-07-21 ENCOUNTER — Encounter (HOSPITAL_BASED_OUTPATIENT_CLINIC_OR_DEPARTMENT_OTHER): Payer: PPO | Admitting: Registered"

## 2018-07-21 ENCOUNTER — Encounter: Payer: Self-pay | Admitting: Registered"

## 2018-07-21 DIAGNOSIS — R7303 Prediabetes: Secondary | ICD-10-CM

## 2018-07-21 NOTE — Progress Notes (Signed)
Patient was seen on 07/21/18 for the Core Session of Diabetes Prevention Program course at [facility]. By the end of this session patients are able to complete the following objectives:   Learning Objectives:  Describe ways to add interest and variety to their activity plans.  Define ?aerobic fitness.  Explain the four F.I.T.T. principles (frequency, intensity, time, and type of activity) and how they relate to aerobic fitness.   Goals:   Record weight taken outside of class.   Track foods and beverages eaten each day in the "Food and Activity Tracker," including calories and fat grams for each item.    Track activity type, minutes you were active, and distance you reached each day in the "Food and Activity Tracker."   Do your best to reach activity goal for the week.  Use one of the F.I.T.T. principles to jump start workouts.  Document activity level on the "To Do Next Week" handout.  Follow-Up Plan:  Attend Core Session next week.   Bring completed "Food and Activity Tracker" next week to be reviewed by Lifestyle Coach.

## 2018-07-28 ENCOUNTER — Encounter: Payer: Self-pay | Admitting: Registered"

## 2018-07-28 ENCOUNTER — Encounter (HOSPITAL_BASED_OUTPATIENT_CLINIC_OR_DEPARTMENT_OTHER): Payer: PPO | Admitting: Registered"

## 2018-07-28 DIAGNOSIS — R7303 Prediabetes: Secondary | ICD-10-CM

## 2018-07-28 NOTE — Progress Notes (Signed)
Patient was seen on 07/28/18 for the Core Session 13 of Diabetes Prevention Program course at [facility]. By the end of this session patients are able to complete the following objectives:   Learning Objectives:  Describe their current progress toward defined goals.  Describe common causes for slipping from healthy eating or being active.  Explain what to do to get back on their feet after a slip.  Goals:   Record weight taken outside of class.   Track foods and beverages eaten each day in the "Food and Activity Tracker," including calories and fat grams for each item.    Track activity type, minutes active, and distance reached each day in the "Food and Activity Tracker."   Try out the two action plans created during session- "Slips from Healthy Eating: Action Plan" and "Slips from Being Active: Action Plan"  Answer questions on the handout.   Follow-Up Plan:  Attend Core Session 14 next week.   Bring completed "Food and Activity Tracker" next week to be reviewed by Lifestyle Coach.

## 2018-08-04 ENCOUNTER — Encounter: Payer: Self-pay | Admitting: Registered"

## 2018-08-04 ENCOUNTER — Encounter: Payer: PPO | Attending: Internal Medicine | Admitting: Registered"

## 2018-08-04 DIAGNOSIS — R7303 Prediabetes: Secondary | ICD-10-CM | POA: Diagnosis not present

## 2018-08-04 NOTE — Progress Notes (Signed)
Patient was seen on 08/04/18 for the Core Session 14 of Diabetes Prevention Program course at [facility]. By the end of this session patients are able to complete the following objectives:   Learning Objectives:  Give examples of problem social cues and helpful social cues.   Explain how to remove problem social cues and add helpful ones.   Describe ways of coping with vacations and social events such as parties, holidays, and visits from relatives and friends.   Create an action plan to change a problem social cue and add a helpful one.   Goals:   Record weight taken outside of class.   Track foods and beverages eaten each day in the "Food and Activity Tracker," including calories and fat grams for each item.    Track activity type, minutes you were active, and distance you reached each day in the "Food and Activity Tracker."   Do your best to reach activity goal for the week.  Use action plan created during session to change a problem social cue and add a helpful social cue.   Answer questions regarding success of changing social cues on "To Do Next Week" handout.   Follow-Up Plan:  Attend Core Session 15 next week.   Bring completed "Food and Activity Tracker" next week to be reviewed by Lifestyle Coach.

## 2018-08-11 ENCOUNTER — Encounter (HOSPITAL_BASED_OUTPATIENT_CLINIC_OR_DEPARTMENT_OTHER): Payer: PPO | Admitting: Registered"

## 2018-08-11 ENCOUNTER — Encounter: Payer: Self-pay | Admitting: Registered"

## 2018-08-11 DIAGNOSIS — R7303 Prediabetes: Secondary | ICD-10-CM | POA: Diagnosis not present

## 2018-08-11 NOTE — Progress Notes (Signed)
Patient was seen on 08/11/18 for the Core Session 15 of Diabetes Prevention Program course at [facility]. By the end of this session patients are able to complete the following objectives:   Learning Objectives:  Explain how to prevent stress or cope with unavoidable stress.   Describe how this program can be a source of stress.   Explain how to manage stressful situations.   Create and follow an action plan for either preventing or coping with a stressful situation.   Goals:   Record weight taken outside of class.   Track foods and beverages eaten each day in the "Food and Activity Tracker," including calories and fat grams for each item.    Track activity type, minutes you were active, and distance you reached each day in the "Food and Activity Tracker."   Do your best to reach activity goal for the week.  Follow your action plan to reduce stress.   Answer questions on handout regarding success of action plan.   Follow-Up Plan:  Attend Core Session 16.   Bring completed "Food and Activity Tracker" next week to be reviewed by Lifestyle Coach.

## 2018-08-14 DIAGNOSIS — R7303 Prediabetes: Secondary | ICD-10-CM | POA: Diagnosis not present

## 2018-08-14 DIAGNOSIS — Z23 Encounter for immunization: Secondary | ICD-10-CM | POA: Diagnosis not present

## 2018-08-14 DIAGNOSIS — F419 Anxiety disorder, unspecified: Secondary | ICD-10-CM | POA: Diagnosis not present

## 2018-08-14 DIAGNOSIS — I10 Essential (primary) hypertension: Secondary | ICD-10-CM | POA: Diagnosis not present

## 2018-08-14 DIAGNOSIS — E7800 Pure hypercholesterolemia, unspecified: Secondary | ICD-10-CM | POA: Diagnosis not present

## 2018-08-25 ENCOUNTER — Ambulatory Visit (HOSPITAL_BASED_OUTPATIENT_CLINIC_OR_DEPARTMENT_OTHER): Payer: PPO | Admitting: Registered"

## 2018-08-25 ENCOUNTER — Encounter: Payer: Self-pay | Admitting: Registered"

## 2018-08-25 DIAGNOSIS — R7303 Prediabetes: Secondary | ICD-10-CM | POA: Diagnosis not present

## 2018-08-25 NOTE — Progress Notes (Signed)
Patient was seen on 08/25/18 for the Core Session 16 of Diabetes Prevention Program course at [facility]. By the end of this session patients are able to complete the following objectives:   Learning Objectives:  Measure their progress toward weight and physical activity goals since Session 1.   Develop a plan for improving progress, if their goals have not yet been attained.   Describe ways to stay motivated long-term.   Goals:   Record weight taken outside of class.   Track foods and beverages eaten each day in the "Food and Activity Tracker," including calories and fat grams for each item.    Track activity type, minutes you were active, and distance you reached each day in the "Food and Activity Tracker."   Utilize action plan to help stay motivated and complete questions on "To Do List."   Follow-Up Plan:  Attend session 17 next week.   Bring completed "Food and Activity Tracker" next session to be reviewed by Lifestyle Coach.

## 2018-09-01 ENCOUNTER — Encounter: Payer: Self-pay | Admitting: Registered"

## 2018-09-01 ENCOUNTER — Encounter: Payer: PPO | Attending: Internal Medicine | Admitting: Registered"

## 2018-09-01 DIAGNOSIS — R7303 Prediabetes: Secondary | ICD-10-CM | POA: Diagnosis not present

## 2018-09-01 NOTE — Progress Notes (Signed)
Patient was seen on 09/01/18 for Session 17 of Diabetes Prevention Program course at [facility]. By the end of this session patients are able to complete the following objectives:   Learning Objectives:  Identify how to maintain and/or continue working toward program goals for the remainder of the program.   Describe ways that food and activity tracking can assist them in maintaining/reaching program goals.   Identify progress they have made since the beginning of the program.   Goals:   Record weight taken outside of class.   Track foods and beverages eaten each day in the "Food and Activity Tracker," including calories and fat grams for each item.    Track activity type, minutes you were active, and distance you reached each day in the "Food and Activity Tracker."   Follow-Up Plan:  Attend session 18 in three weeks.   Bring completed "Food and Activity Trackers" next session to be reviewed by Lifestyle Coach.

## 2018-09-22 ENCOUNTER — Encounter: Payer: Self-pay | Admitting: Registered"

## 2018-09-22 ENCOUNTER — Encounter (HOSPITAL_BASED_OUTPATIENT_CLINIC_OR_DEPARTMENT_OTHER): Payer: PPO | Admitting: Registered"

## 2018-09-22 DIAGNOSIS — R7303 Prediabetes: Secondary | ICD-10-CM | POA: Diagnosis not present

## 2018-09-22 NOTE — Progress Notes (Signed)
Patient was seen on 09/22/18 for Session 18 of Diabetes Prevention Program course at [facility]. By the end of this session patients are able to complete the following objectives:   Learning Objectives:  Explain how glucose is used in the body and it's relationship with insulin/insulin resistance.   Identify symptoms of diabetes.   Describe lab tests used to diagnose diabetes.   Describe health complications and conditions related to diabetes.   Goals:   Record weight taken outside of class.   Track foods and beverages eaten each day in the "Food and Activity Tracker," including calories and fat grams for each item.    Track activity type, minutes you were active, and distance you reached each day in the "Food and Activity Tracker."   Follow-Up Plan:  Attend session 19 in two weeks.   Bring completed "Food and Activity Trackers" to next session to be reviewed by Lifestyle Coach.

## 2018-10-06 ENCOUNTER — Encounter: Payer: Self-pay | Admitting: Registered"

## 2018-10-06 ENCOUNTER — Encounter: Payer: PPO | Attending: Internal Medicine | Admitting: Registered"

## 2018-10-06 DIAGNOSIS — R7303 Prediabetes: Secondary | ICD-10-CM | POA: Diagnosis not present

## 2018-10-06 NOTE — Progress Notes (Signed)
Patient was seen on 10/06/18 for the Diabetes Prevention Program course at [facility]. By the end of this session patients are able to complete the following objectives:   Learning Objectives:  Identify which foods contain carbohydrates.   List components of a balanced meal.   Goals:   Record weight taken outside of class.   Track foods and beverages eaten each day in the "Food and Activity Tracker," including calories and fat grams for each item.    Track activity type, minutes active, and distance reached each day in the "Food and Activity Tracker."   Follow-Up Plan:  Attend next session.   Bring completed "Food and Activity Tracker" next week to be reviewed by Lifestyle Coach.

## 2018-10-18 DIAGNOSIS — E7800 Pure hypercholesterolemia, unspecified: Secondary | ICD-10-CM | POA: Diagnosis not present

## 2018-10-18 DIAGNOSIS — Z119 Encounter for screening for infectious and parasitic diseases, unspecified: Secondary | ICD-10-CM | POA: Diagnosis not present

## 2018-10-18 DIAGNOSIS — R42 Dizziness and giddiness: Secondary | ICD-10-CM | POA: Diagnosis not present

## 2018-10-18 DIAGNOSIS — R7611 Nonspecific reaction to tuberculin skin test without active tuberculosis: Secondary | ICD-10-CM | POA: Diagnosis not present

## 2018-10-24 ENCOUNTER — Encounter: Payer: Self-pay | Admitting: Neurology

## 2018-11-03 ENCOUNTER — Encounter: Payer: PPO | Attending: Internal Medicine | Admitting: Registered"

## 2018-11-03 ENCOUNTER — Encounter: Payer: Self-pay | Admitting: Registered"

## 2018-11-03 ENCOUNTER — Ambulatory Visit
Admission: RE | Admit: 2018-11-03 | Discharge: 2018-11-03 | Disposition: A | Discharge status: Home / Self Care | Payer: PPO | Source: Ambulatory Visit | Attending: Internal Medicine | Admitting: Internal Medicine

## 2018-11-03 ENCOUNTER — Other Ambulatory Visit: Payer: Self-pay | Admitting: Internal Medicine

## 2018-11-03 DIAGNOSIS — R7303 Prediabetes: Secondary | ICD-10-CM | POA: Diagnosis not present

## 2018-11-03 DIAGNOSIS — R7611 Nonspecific reaction to tuberculin skin test without active tuberculosis: Secondary | ICD-10-CM | POA: Diagnosis not present

## 2018-11-03 DIAGNOSIS — B1910 Unspecified viral hepatitis B without hepatic coma: Secondary | ICD-10-CM | POA: Diagnosis not present

## 2018-11-03 NOTE — Progress Notes (Signed)
Patient was seen on 11/03/18 for the Diabetes Prevention Program course at [facility]. By the end of this session patients are able to complete the following objectives:   Learning Objectives:  Describe the differences between unsaturated, saturated, and trans fat on heart health.   List dietary sources of unsaturated, saturated, and trans fats.  Explain ways to reduce intake of saturated fat and replace them with heart healthy fats.  Goals:   Record weight taken outside of class.   Track foods and beverages eaten each day in the "Food and Activity Tracker," including calories and fat grams for each item.    Track activity type, minutes you were active, and distance you reached each day in the "Food and Activity Tracker."   Follow-Up Plan:  Attend next session.   Bring completed "Food and Activity Trackers" to next session to be reviewed by Lifestyle Coach.

## 2018-12-01 DIAGNOSIS — M19011 Primary osteoarthritis, right shoulder: Secondary | ICD-10-CM | POA: Diagnosis not present

## 2018-12-01 DIAGNOSIS — M25511 Pain in right shoulder: Secondary | ICD-10-CM | POA: Diagnosis not present

## 2018-12-08 ENCOUNTER — Encounter: Payer: PPO | Admitting: Registered"

## 2018-12-27 NOTE — Progress Notes (Addendum)
NEUROLOGY CONSULTATION NOTE  Monica Mcintyre MRN: [id number] DOB: 07-24-1951  Referring provider: Emi Belfast, MD Primary care provider: Emi Belfast, MD  Reason for consult:  dizziness  HISTORY OF PRESENT ILLNESS: Monica Mcintyre is a 68 year old right-handed African American woman with hypertension and and acquired hypothyroidism for chronic vertigo.  History supplemented by referring provider note.  She started having dizziness about a year ago.  No preceding event.  At first, she thought it was allergies.  It is intermittent.  She has trouble explaining the symptoms but it is not really a spinning sensation.  She has blurred vision and difficulty focusing.  There is associated nausea but no vomiting.  There is no associated double vision, headache or unilateral numbness or weakness.  She states it happens spontaneously, not specifically triggered by movement.  However, turning her body during exercise may trigger it.  She is able to walk but feels unstable.  It typically would last 30 minutes.  She would need to sit down and let it pass.  Frequency is sporadic.  It may occur daily for 1 to 2 weeks and then she is free of symptoms for 4 months.  She reports aural fullness in both ears but denies hearing loss or tinnitus.  She has not been evaluated by ENT.  Meclizine helps.  CT of head with and without contrast from 03/08/18 was personally reviewed and was negative.  She denies history of migraines.  CMP from 10/19/18 was unremarkable.  PAST MEDICAL HISTORY: Past Medical History:  Diagnosis Date  . Diverticulosis   . Hypertension   . Thyroid disease    PAST SURGICAL HISTORY: Past Surgical History:  Procedure Laterality Date  . ABDOMINAL HYSTERECTOMY    . KNEE ARTHROSCOPY    . TONSILLECTOMY      MEDICATIONS: Current Outpatient Medications on File Prior to Visit  Medication Sig Dispense Refill  . ibuprofen (ADVIL,MOTRIN) 600 MG tablet Take 600 mg by  mouth every 8 (eight) hours as needed.    Marland Kitchen levothyroxine (SYNTHROID, LEVOTHROID) 75 MCG tablet Take 75 mcg by mouth daily.     No current facility-administered medications on file prior to visit.     ALLERGIES: Allergies  Allergen Reactions  . Codeine Nausea And Vomiting    FAMILY HISTORY: Family History  Problem Relation Age of Onset  . Hyperlipidemia Mother   . Hypertension Mother   . Diabetes Neg Hx   . Heart attack Neg Hx   . Sudden death Neg Hx    SOCIAL HISTORY: Social History   Socioeconomic History  . Marital status: Unknown    Spouse name: Not on file  . Number of children: Not on file  . Years of education: Not on file  . Highest education level: Not on file  Occupational History  . Not on file  Social Needs  . Financial resource strain: Not on file  . Food insecurity:    Worry: Not on file    Inability: Not on file  . Transportation needs:    Medical: Not on file    Non-medical: Not on file  Tobacco Use  . Smoking status: Never Smoker  Substance and Sexual Activity  . Alcohol use: No  . Drug use: No  . Sexual activity: Not on file  Lifestyle  . Physical activity:    Days per week: Not on file    Minutes per session: Not on file  . Stress: Not on file  Relationships  . Social connections:  Talks on phone: Not on file    Gets together: Not on file    Attends religious service: Not on file    Active member of club or organization: Not on file    Attends meetings of clubs or organizations: Not on file    Relationship status: Not on file  . Intimate partner violence:    Fear of current or ex partner: Not on file    Emotionally abused: Not on file    Physically abused: Not on file    Forced sexual activity: Not on file  Other Topics Concern  . Not on file  Social History Narrative  . Not on file    REVIEW OF SYSTEMS: Constitutional: No fevers, chills, or sweats, no generalized fatigue, change in appetite Eyes: No visual changes, double  vision, eye pain Ear, nose and throat: No hearing loss, ear pain, nasal congestion, sore throat Cardiovascular: No chest pain, palpitations Respiratory:  No shortness of breath at rest or with exertion, wheezes GastrointestinaI: No nausea, vomiting, diarrhea, abdominal pain, fecal incontinence Genitourinary:  No dysuria, urinary retention or frequency Musculoskeletal:  No neck pain, back pain Integumentary: No rash, pruritus, skin lesions Neurological: as above Psychiatric: No depression, insomnia, anxiety Endocrine: No palpitations, fatigue, diaphoresis, mood swings, change in appetite, change in weight, increased thirst Hematologic/Lymphatic:  No purpura, petechiae. Allergic/Immunologic: no itchy/runny eyes, nasal congestion, recent allergic reactions, rashes  PHYSICAL EXAM: Blood pressure 120/64, pulse 63, height 5\' 4"  (1.626 m), weight 167 lb (75.8 kg), SpO2 99 %. General: No acute distress.  Patient appears well-groomed.   Head:  Normocephalic/atraumatic Eyes:  fundi examined but not visualized Neck: supple, no paraspinal tenderness, full range of motion Back: No paraspinal tenderness Heart: regular rate and rhythm Lungs: Clear to auscultation bilaterally. Vascular: No carotid bruits. Neurological Exam: Mental status: alert and oriented to person, place, and time, recent and remote memory intact, fund of knowledge intact, attention and concentration intact, speech fluent and not dysarthric, language intact. Cranial nerves: CN I: not tested CN II: pupils equal, round and reactive to light, visual fields intact CN III, IV, VI:  full range of motion, no nystagmus, no ptosis CN V: facial sensation intact CN VII: upper and lower face symmetric CN VIII: hearing intact CN IX, X: gag intact, uvula midline CN XI: sternocleidomastoid and trapezius muscles intact CN XII: tongue midline Bulk & Tone: normal, no fasciculations. Motor:  5/5 throughout  Sensation:  Pinprick and vibration  sensation intact.  Deep Tendon Reflexes:  2+ throughout, toes downgoing.   Finger to nose testing:  Without dysmetria.   Heel to shin:  Without dysmetria.   Gait:  Normal station and stride.  Able to turn and tandem walk. Romberg negative.  IMPRESSION: Intermittent dizziness.  Not quite spinning but some sense of movement suggesting vertigo.  It can be triggered by head movement.  Neurologic exam is normal.  I don't suspect a primary neurologic etiology.  Short duration and lack of past history suggests this is not migraine.  Given that symptoms are intermittent with periods of symptom-freedom, I don't suspect an intracranial lesion.  Consider ENT consult  Thank you for allowing me to take part in the care of this patient.  Metta Clines, DO  CC: Emi Belfast, MD

## 2018-12-28 ENCOUNTER — Ambulatory Visit (INDEPENDENT_AMBULATORY_CARE_PROVIDER_SITE_OTHER): Payer: PPO | Admitting: Neurology

## 2018-12-28 ENCOUNTER — Encounter: Payer: Self-pay | Admitting: Neurology

## 2018-12-28 VITALS — BP 120/64 | HR 63 | Ht 64.0 in | Wt 167.0 lb

## 2018-12-28 DIAGNOSIS — R42 Dizziness and giddiness: Secondary | ICD-10-CM

## 2018-12-28 NOTE — Patient Instructions (Signed)
I don't have a neurologic diagnosis for you.   Consider seeing ear, nose and throat.

## 2019-01-23 ENCOUNTER — Ambulatory Visit (HOSPITAL_BASED_OUTPATIENT_CLINIC_OR_DEPARTMENT_OTHER): Payer: PPO | Admitting: Registered"

## 2019-01-23 ENCOUNTER — Encounter: Payer: Self-pay | Admitting: Registered"

## 2019-01-23 DIAGNOSIS — R7303 Prediabetes: Secondary | ICD-10-CM | POA: Diagnosis not present

## 2019-01-23 NOTE — Progress Notes (Signed)
On 01/23/19 patient completed Session 19 of the Diabetes Prevention Program course through [facility]. By the end of this session patients are able to complete the following objectives:   Learning Objectives:  List ways to make recipes healthier.   List lower-fat and lower-calorie substitutions for common ingredients.   Identify low-fat cooking methods.   Describe how to choose a cookbook that works best for their needs.   Goals:   Record weight taken outside of class.   Track foods and beverages eaten each day in the "Food and Activity Tracker," including calories and fat grams for each item.    Track activity type, minutes you were active, and distance you reached each day in the "Food and Activity Tracker."   Follow-Up Plan:  Attend next session.    Bring completed "Food and Activity Trackers" to next session to be reviewed by Lifestyle Coach.

## 2019-02-01 ENCOUNTER — Encounter: Payer: Self-pay | Admitting: Registered"

## 2019-02-01 ENCOUNTER — Ambulatory Visit (HOSPITAL_BASED_OUTPATIENT_CLINIC_OR_DEPARTMENT_OTHER): Payer: PPO | Admitting: Registered"

## 2019-02-01 DIAGNOSIS — R7303 Prediabetes: Secondary | ICD-10-CM

## 2019-02-01 NOTE — Progress Notes (Signed)
On 02/01/19 pt completed a core maintenance session for the Diabetes Prevention Program course. By the end of this session patients are able to complete the following objectives:   Learning Objectives:  Describe how to incorporate more fruits and vegetables into meals.  List criteria for selecting good quality fruits and vegetables at the store.   Define mindful eating.  List the benefits of eating mindfully.    Goals:   Record weight taken outside of class.   Track foods and beverages eaten each day in the "Food and Activity Tracker," including calories and fat grams for each item.    Track activity type, minutes you were active, and distance you reached each day in the "Food and Activity Tracker."   Follow-Up Plan:  Attend next session.   Bring completed "Food and Activity Trackers" to next session to be reviewed by Lifestyle Coach.

## 2019-02-13 DIAGNOSIS — R7611 Nonspecific reaction to tuberculin skin test without active tuberculosis: Secondary | ICD-10-CM | POA: Diagnosis not present

## 2019-02-13 DIAGNOSIS — E039 Hypothyroidism, unspecified: Secondary | ICD-10-CM | POA: Diagnosis not present

## 2019-02-13 DIAGNOSIS — R768 Other specified abnormal immunological findings in serum: Secondary | ICD-10-CM | POA: Diagnosis not present

## 2019-02-13 DIAGNOSIS — F419 Anxiety disorder, unspecified: Secondary | ICD-10-CM | POA: Diagnosis not present

## 2019-02-13 DIAGNOSIS — R42 Dizziness and giddiness: Secondary | ICD-10-CM | POA: Diagnosis not present

## 2019-03-23 ENCOUNTER — Encounter: Payer: PPO | Attending: Internal Medicine | Admitting: Registered"

## 2019-03-23 ENCOUNTER — Encounter: Payer: Self-pay | Admitting: Registered"

## 2019-03-23 DIAGNOSIS — R7303 Prediabetes: Secondary | ICD-10-CM | POA: Insufficient documentation

## 2019-03-23 NOTE — Progress Notes (Signed)
On 03/23/19 patient attended a virtual session of the Diabetes Prevention Program course. By the end of this session patients are able to complete the following objectives:   Learning Objectives:  Define fiber and describe the difference between insoluble and soluble fiber   List foods that are good sources of fiber  Explain the health benefits of fiber   Describe ways to increase volume of meals and snacks while staying within fat goal.   Goals:   Record weight taken outside of class.   Track foods and beverages eaten each day in the "Food and Activity Tracker," including calories and fat grams for each item.    Track activity type, minutes you were active, and distance you reached each day in the "Food and Activity Tracker."   Follow-Up Plan:  Attend next session.   Bring completed "Food and Activity Trackers" to next session to be reviewed by Lifestyle Coach.

## 2019-04-06 ENCOUNTER — Encounter: Payer: PPO | Attending: Internal Medicine

## 2019-04-06 DIAGNOSIS — R7303 Prediabetes: Secondary | ICD-10-CM | POA: Insufficient documentation

## 2019-04-20 ENCOUNTER — Encounter: Payer: Self-pay | Admitting: Registered"

## 2019-04-20 ENCOUNTER — Encounter (HOSPITAL_BASED_OUTPATIENT_CLINIC_OR_DEPARTMENT_OTHER): Payer: PPO | Admitting: Registered"

## 2019-04-20 DIAGNOSIS — R7303 Prediabetes: Secondary | ICD-10-CM

## 2019-04-20 NOTE — Progress Notes (Signed)
On 04/20/19 patient completed the final session of the Diabetes Prevention Program course. By the end of this session patients are able to complete the following objectives:   Learning Objectives:  Reflect on lifestyle changes they have made since starting the DPP.   Set long-term goals to promote continued maintenance of lifestyle changes made during the program.   Goals:   Work toward reaching new long-term goals set during class.   Follow-Up Plan:  Contact Lifestyle Coach with questions/concerns PRN.

## 2019-05-16 DIAGNOSIS — H8110 Benign paroxysmal vertigo, unspecified ear: Secondary | ICD-10-CM | POA: Diagnosis not present

## 2019-05-16 DIAGNOSIS — H903 Sensorineural hearing loss, bilateral: Secondary | ICD-10-CM | POA: Diagnosis not present

## 2019-05-30 DIAGNOSIS — H8112 Benign paroxysmal vertigo, left ear: Secondary | ICD-10-CM | POA: Diagnosis not present

## 2019-06-13 DIAGNOSIS — H8112 Benign paroxysmal vertigo, left ear: Secondary | ICD-10-CM | POA: Diagnosis not present

## 2019-06-27 DIAGNOSIS — H8112 Benign paroxysmal vertigo, left ear: Secondary | ICD-10-CM | POA: Diagnosis not present

## 2019-07-11 DIAGNOSIS — H8112 Benign paroxysmal vertigo, left ear: Secondary | ICD-10-CM | POA: Diagnosis not present

## 2019-07-25 DIAGNOSIS — H8112 Benign paroxysmal vertigo, left ear: Secondary | ICD-10-CM | POA: Diagnosis not present

## 2019-08-02 DIAGNOSIS — J069 Acute upper respiratory infection, unspecified: Secondary | ICD-10-CM | POA: Diagnosis not present

## 2019-08-02 DIAGNOSIS — F419 Anxiety disorder, unspecified: Secondary | ICD-10-CM | POA: Diagnosis not present

## 2019-08-02 DIAGNOSIS — E039 Hypothyroidism, unspecified: Secondary | ICD-10-CM | POA: Diagnosis not present

## 2019-08-02 DIAGNOSIS — J029 Acute pharyngitis, unspecified: Secondary | ICD-10-CM | POA: Diagnosis not present

## 2019-08-09 DIAGNOSIS — H8112 Benign paroxysmal vertigo, left ear: Secondary | ICD-10-CM | POA: Diagnosis not present

## 2019-09-11 DIAGNOSIS — E7800 Pure hypercholesterolemia, unspecified: Secondary | ICD-10-CM | POA: Diagnosis not present

## 2019-09-11 DIAGNOSIS — E039 Hypothyroidism, unspecified: Secondary | ICD-10-CM | POA: Diagnosis not present

## 2019-09-11 DIAGNOSIS — F419 Anxiety disorder, unspecified: Secondary | ICD-10-CM | POA: Diagnosis not present

## 2019-09-11 DIAGNOSIS — R7303 Prediabetes: Secondary | ICD-10-CM | POA: Diagnosis not present

## 2019-09-11 DIAGNOSIS — I10 Essential (primary) hypertension: Secondary | ICD-10-CM | POA: Diagnosis not present

## 2019-09-11 DIAGNOSIS — Z0000 Encounter for general adult medical examination without abnormal findings: Secondary | ICD-10-CM | POA: Diagnosis not present

## 2019-09-11 DIAGNOSIS — Z1231 Encounter for screening mammogram for malignant neoplasm of breast: Secondary | ICD-10-CM | POA: Diagnosis not present

## 2019-09-11 DIAGNOSIS — R7611 Nonspecific reaction to tuberculin skin test without active tuberculosis: Secondary | ICD-10-CM | POA: Diagnosis not present

## 2019-09-11 DIAGNOSIS — E559 Vitamin D deficiency, unspecified: Secondary | ICD-10-CM | POA: Diagnosis not present

## 2019-09-11 DIAGNOSIS — G5603 Carpal tunnel syndrome, bilateral upper limbs: Secondary | ICD-10-CM | POA: Diagnosis not present

## 2019-09-11 DIAGNOSIS — D126 Benign neoplasm of colon, unspecified: Secondary | ICD-10-CM | POA: Diagnosis not present

## 2019-10-10 DIAGNOSIS — Z1231 Encounter for screening mammogram for malignant neoplasm of breast: Secondary | ICD-10-CM | POA: Diagnosis not present

## 2019-11-22 ENCOUNTER — Ambulatory Visit: Payer: PPO | Attending: Internal Medicine

## 2019-11-22 DIAGNOSIS — Z23 Encounter for immunization: Secondary | ICD-10-CM

## 2019-11-22 NOTE — Progress Notes (Signed)
   Covid-19 Vaccination Clinic  Name:  OSHAY DOCKEN    MRN: [id number] DOB: 07/03/1951  11/22/2019  Ms. Hopkinson was observed post Covid-19 immunization for 15 minutes without incidence. She was provided with Vaccine Information Sheet and instruction to access the V-Safe system.   Ms. Shrock was instructed to call 911 with any severe reactions post vaccine: Marland Kitchen Difficulty breathing  . Swelling of your face and throat  . A fast heartbeat  . A bad rash all over your body  . Dizziness and weakness    Immunizations Administered    Name Date Dose VIS Date Route   Pfizer COVID-19 Vaccine 11/22/2019  2:43 PM 0.3 mL 10/12/2019 Intramuscular   Manufacturer: [place]   Lot: [id number]   [place]: [id number]

## 2019-12-05 DIAGNOSIS — H52203 Unspecified astigmatism, bilateral: Secondary | ICD-10-CM | POA: Diagnosis not present

## 2019-12-05 DIAGNOSIS — H25013 Cortical age-related cataract, bilateral: Secondary | ICD-10-CM | POA: Diagnosis not present

## 2019-12-05 DIAGNOSIS — H2513 Age-related nuclear cataract, bilateral: Secondary | ICD-10-CM | POA: Diagnosis not present

## 2019-12-05 DIAGNOSIS — H524 Presbyopia: Secondary | ICD-10-CM | POA: Diagnosis not present

## 2019-12-05 DIAGNOSIS — H40013 Open angle with borderline findings, low risk, bilateral: Secondary | ICD-10-CM | POA: Diagnosis not present

## 2019-12-05 DIAGNOSIS — H5203 Hypermetropia, bilateral: Secondary | ICD-10-CM | POA: Diagnosis not present

## 2019-12-13 ENCOUNTER — Ambulatory Visit: Payer: PPO | Attending: Internal Medicine

## 2019-12-13 DIAGNOSIS — Z23 Encounter for immunization: Secondary | ICD-10-CM | POA: Insufficient documentation

## 2019-12-13 NOTE — Progress Notes (Signed)
   Covid-19 Vaccination Clinic  Name:  Monica Mcintyre    MRN: [id number] DOB: Dec 18, 1950  12/13/2019  [name] was observed post Covid-19 immunization for 15 minutes without incidence. She was provided with Vaccine Information Sheet and instruction to access the V-Safe system.   [name] was instructed to call 911 with any severe reactions post vaccine: Marland Kitchen Difficulty breathing  . Swelling of your face and throat  . A fast heartbeat  . A bad rash all over your body  . Dizziness and weakness    Immunizations Administered    Name Date Dose VIS Date Route   Pfizer COVID-19 Vaccine 12/13/2019  3:18 PM 0.3 mL 10/12/2019 Intramuscular   Manufacturer: [place]   Lot: [id number]   [place]: [id number]

## 2020-04-18 DIAGNOSIS — I10 Essential (primary) hypertension: Secondary | ICD-10-CM | POA: Diagnosis not present

## 2020-04-18 DIAGNOSIS — E785 Hyperlipidemia, unspecified: Secondary | ICD-10-CM | POA: Diagnosis not present

## 2020-04-18 DIAGNOSIS — E039 Hypothyroidism, unspecified: Secondary | ICD-10-CM | POA: Diagnosis not present

## 2020-04-18 DIAGNOSIS — F419 Anxiety disorder, unspecified: Secondary | ICD-10-CM | POA: Diagnosis not present

## 2020-05-23 DIAGNOSIS — D649 Anemia, unspecified: Secondary | ICD-10-CM | POA: Diagnosis not present

## 2020-05-23 DIAGNOSIS — E539 Vitamin B deficiency, unspecified: Secondary | ICD-10-CM | POA: Diagnosis not present

## 2020-05-23 DIAGNOSIS — R5383 Other fatigue: Secondary | ICD-10-CM | POA: Diagnosis not present

## 2020-06-17 DIAGNOSIS — H60333 Swimmer's ear, bilateral: Secondary | ICD-10-CM | POA: Diagnosis not present

## 2020-07-11 DIAGNOSIS — H60333 Swimmer's ear, bilateral: Secondary | ICD-10-CM | POA: Diagnosis not present

## 2020-07-11 DIAGNOSIS — H6983 Other specified disorders of Eustachian tube, bilateral: Secondary | ICD-10-CM | POA: Diagnosis not present

## 2020-07-11 DIAGNOSIS — H8113 Benign paroxysmal vertigo, bilateral: Secondary | ICD-10-CM | POA: Diagnosis not present

## 2020-07-11 DIAGNOSIS — E785 Hyperlipidemia, unspecified: Secondary | ICD-10-CM | POA: Diagnosis not present

## 2020-09-13 ENCOUNTER — Ambulatory Visit: Payer: PPO | Attending: Internal Medicine

## 2020-09-13 DIAGNOSIS — Z23 Encounter for immunization: Secondary | ICD-10-CM

## 2020-09-13 NOTE — Progress Notes (Signed)
   Covid-19 Vaccination Clinic  Name:  ALASIA ENGE    MRN: [id number] DOB: 08/18/1951  09/13/2020  Ms. Lheureux was observed post Covid-19 immunization for 15 minutes without incident. She was provided with Vaccine Information Sheet and instruction to access the V-Safe system.   Ms. Penning was instructed to call 911 with any severe reactions post vaccine: Marland Kitchen Difficulty breathing  . Swelling of face and throat  . A fast heartbeat  . A bad rash all over body  . Dizziness and weakness   Immunizations Administered    Name Date Dose VIS Date Route   Pfizer COVID-19 Vaccine 09/13/2020  2:54 PM 0.3 mL 08/20/2020 Intramuscular   Manufacturer: [place]   Lot: [id number]   [place]: [id number]

## 2020-09-16 DIAGNOSIS — D649 Anemia, unspecified: Secondary | ICD-10-CM | POA: Diagnosis not present

## 2020-09-16 DIAGNOSIS — E039 Hypothyroidism, unspecified: Secondary | ICD-10-CM | POA: Diagnosis not present

## 2020-09-16 DIAGNOSIS — Z1389 Encounter for screening for other disorder: Secondary | ICD-10-CM | POA: Diagnosis not present

## 2020-09-16 DIAGNOSIS — F419 Anxiety disorder, unspecified: Secondary | ICD-10-CM | POA: Diagnosis not present

## 2020-09-16 DIAGNOSIS — R7309 Other abnormal glucose: Secondary | ICD-10-CM | POA: Diagnosis not present

## 2020-09-16 DIAGNOSIS — E785 Hyperlipidemia, unspecified: Secondary | ICD-10-CM | POA: Diagnosis not present

## 2020-09-16 DIAGNOSIS — I10 Essential (primary) hypertension: Secondary | ICD-10-CM | POA: Diagnosis not present

## 2020-09-16 DIAGNOSIS — Z0000 Encounter for general adult medical examination without abnormal findings: Secondary | ICD-10-CM | POA: Diagnosis not present

## 2020-09-16 DIAGNOSIS — H8113 Benign paroxysmal vertigo, bilateral: Secondary | ICD-10-CM | POA: Diagnosis not present

## 2020-09-16 DIAGNOSIS — Z7189 Other specified counseling: Secondary | ICD-10-CM | POA: Diagnosis not present

## 2020-10-10 DIAGNOSIS — M170 Bilateral primary osteoarthritis of knee: Secondary | ICD-10-CM | POA: Diagnosis not present

## 2020-10-10 DIAGNOSIS — M238X1 Other internal derangements of right knee: Secondary | ICD-10-CM | POA: Diagnosis not present

## 2020-10-10 DIAGNOSIS — M238X2 Other internal derangements of left knee: Secondary | ICD-10-CM | POA: Diagnosis not present

## 2020-10-10 DIAGNOSIS — Z23 Encounter for immunization: Secondary | ICD-10-CM | POA: Diagnosis not present

## 2020-10-13 ENCOUNTER — Ambulatory Visit
Admission: RE | Admit: 2020-10-13 | Discharge: 2020-10-13 | Disposition: A | Discharge status: Home / Self Care | Payer: PPO | Source: Ambulatory Visit | Attending: Internal Medicine | Admitting: Internal Medicine

## 2020-10-13 ENCOUNTER — Other Ambulatory Visit: Payer: Self-pay | Admitting: Internal Medicine

## 2020-10-13 DIAGNOSIS — M1712 Unilateral primary osteoarthritis, left knee: Secondary | ICD-10-CM | POA: Diagnosis not present

## 2020-10-13 DIAGNOSIS — M25861 Other specified joint disorders, right knee: Secondary | ICD-10-CM | POA: Diagnosis not present

## 2020-10-13 DIAGNOSIS — M1711 Unilateral primary osteoarthritis, right knee: Secondary | ICD-10-CM | POA: Diagnosis not present

## 2020-10-13 DIAGNOSIS — M170 Bilateral primary osteoarthritis of knee: Secondary | ICD-10-CM

## 2020-10-13 DIAGNOSIS — M25461 Effusion, right knee: Secondary | ICD-10-CM | POA: Diagnosis not present

## 2020-10-23 DIAGNOSIS — M13861 Other specified arthritis, right knee: Secondary | ICD-10-CM | POA: Diagnosis not present

## 2020-10-23 DIAGNOSIS — M25561 Pain in right knee: Secondary | ICD-10-CM | POA: Diagnosis not present

## 2020-10-23 DIAGNOSIS — M25562 Pain in left knee: Secondary | ICD-10-CM | POA: Diagnosis not present

## 2020-10-23 DIAGNOSIS — M13862 Other specified arthritis, left knee: Secondary | ICD-10-CM | POA: Diagnosis not present

## 2020-10-29 DIAGNOSIS — Z1231 Encounter for screening mammogram for malignant neoplasm of breast: Secondary | ICD-10-CM | POA: Diagnosis not present

## 2020-11-26 DIAGNOSIS — M7542 Impingement syndrome of left shoulder: Secondary | ICD-10-CM | POA: Diagnosis not present

## 2020-12-12 DIAGNOSIS — I10 Essential (primary) hypertension: Secondary | ICD-10-CM | POA: Diagnosis not present

## 2020-12-12 DIAGNOSIS — M170 Bilateral primary osteoarthritis of knee: Secondary | ICD-10-CM | POA: Diagnosis not present

## 2020-12-12 DIAGNOSIS — M7542 Impingement syndrome of left shoulder: Secondary | ICD-10-CM | POA: Diagnosis not present

## 2021-01-19 DIAGNOSIS — M7542 Impingement syndrome of left shoulder: Secondary | ICD-10-CM | POA: Diagnosis not present

## 2021-01-19 DIAGNOSIS — M25512 Pain in left shoulder: Secondary | ICD-10-CM | POA: Diagnosis not present

## 2021-02-10 DIAGNOSIS — M7542 Impingement syndrome of left shoulder: Secondary | ICD-10-CM | POA: Diagnosis not present

## 2021-02-17 DIAGNOSIS — R52 Pain, unspecified: Secondary | ICD-10-CM | POA: Diagnosis not present

## 2021-02-17 DIAGNOSIS — M7542 Impingement syndrome of left shoulder: Secondary | ICD-10-CM | POA: Diagnosis not present

## 2021-02-17 DIAGNOSIS — M75102 Unspecified rotator cuff tear or rupture of left shoulder, not specified as traumatic: Secondary | ICD-10-CM | POA: Diagnosis not present

## 2021-02-17 DIAGNOSIS — M13812 Other specified arthritis, left shoulder: Secondary | ICD-10-CM | POA: Diagnosis not present

## 2021-02-27 DIAGNOSIS — M75102 Unspecified rotator cuff tear or rupture of left shoulder, not specified as traumatic: Secondary | ICD-10-CM | POA: Diagnosis not present

## 2021-03-18 DIAGNOSIS — I10 Essential (primary) hypertension: Secondary | ICD-10-CM | POA: Diagnosis not present

## 2021-03-18 DIAGNOSIS — M7542 Impingement syndrome of left shoulder: Secondary | ICD-10-CM | POA: Diagnosis not present

## 2021-03-18 DIAGNOSIS — D126 Benign neoplasm of colon, unspecified: Secondary | ICD-10-CM | POA: Diagnosis not present

## 2021-03-18 DIAGNOSIS — E785 Hyperlipidemia, unspecified: Secondary | ICD-10-CM | POA: Diagnosis not present

## 2021-04-20 DIAGNOSIS — M94212 Chondromalacia, left shoulder: Secondary | ICD-10-CM | POA: Diagnosis not present

## 2021-04-20 DIAGNOSIS — M7522 Bicipital tendinitis, left shoulder: Secondary | ICD-10-CM | POA: Diagnosis not present

## 2021-04-20 DIAGNOSIS — M19012 Primary osteoarthritis, left shoulder: Secondary | ICD-10-CM | POA: Diagnosis not present

## 2021-04-20 DIAGNOSIS — M7552 Bursitis of left shoulder: Secondary | ICD-10-CM | POA: Diagnosis not present

## 2021-04-20 DIAGNOSIS — M75112 Incomplete rotator cuff tear or rupture of left shoulder, not specified as traumatic: Secondary | ICD-10-CM | POA: Diagnosis not present

## 2021-04-20 DIAGNOSIS — M75102 Unspecified rotator cuff tear or rupture of left shoulder, not specified as traumatic: Secondary | ICD-10-CM | POA: Diagnosis not present

## 2021-04-20 DIAGNOSIS — M24112 Other articular cartilage disorders, left shoulder: Secondary | ICD-10-CM | POA: Diagnosis not present

## 2021-04-20 DIAGNOSIS — S43492A Other sprain of left shoulder joint, initial encounter: Secondary | ICD-10-CM | POA: Diagnosis not present

## 2021-04-20 DIAGNOSIS — M7542 Impingement syndrome of left shoulder: Secondary | ICD-10-CM | POA: Diagnosis not present

## 2021-04-20 DIAGNOSIS — G8918 Other acute postprocedural pain: Secondary | ICD-10-CM | POA: Diagnosis not present

## 2021-04-27 DIAGNOSIS — M25512 Pain in left shoulder: Secondary | ICD-10-CM | POA: Diagnosis not present

## 2021-04-30 DIAGNOSIS — M25512 Pain in left shoulder: Secondary | ICD-10-CM | POA: Diagnosis not present

## 2021-05-06 DIAGNOSIS — M25512 Pain in left shoulder: Secondary | ICD-10-CM | POA: Diagnosis not present

## 2021-05-20 DIAGNOSIS — M25512 Pain in left shoulder: Secondary | ICD-10-CM | POA: Diagnosis not present

## 2021-06-03 DIAGNOSIS — M25512 Pain in left shoulder: Secondary | ICD-10-CM | POA: Diagnosis not present

## 2021-06-05 DIAGNOSIS — Z23 Encounter for immunization: Secondary | ICD-10-CM | POA: Diagnosis not present

## 2021-06-05 DIAGNOSIS — M25562 Pain in left knee: Secondary | ICD-10-CM | POA: Diagnosis not present

## 2021-08-13 DIAGNOSIS — M170 Bilateral primary osteoarthritis of knee: Secondary | ICD-10-CM | POA: Diagnosis not present

## 2021-08-17 DIAGNOSIS — M25512 Pain in left shoulder: Secondary | ICD-10-CM | POA: Diagnosis not present

## 2021-08-31 DIAGNOSIS — K5900 Constipation, unspecified: Secondary | ICD-10-CM | POA: Diagnosis not present

## 2021-09-04 DIAGNOSIS — M7542 Impingement syndrome of left shoulder: Secondary | ICD-10-CM | POA: Diagnosis not present

## 2021-09-16 DIAGNOSIS — E559 Vitamin D deficiency, unspecified: Secondary | ICD-10-CM | POA: Diagnosis not present

## 2021-09-16 DIAGNOSIS — R7309 Other abnormal glucose: Secondary | ICD-10-CM | POA: Diagnosis not present

## 2021-09-16 DIAGNOSIS — Z23 Encounter for immunization: Secondary | ICD-10-CM | POA: Diagnosis not present

## 2021-09-16 DIAGNOSIS — E785 Hyperlipidemia, unspecified: Secondary | ICD-10-CM | POA: Diagnosis not present

## 2021-09-16 DIAGNOSIS — Z86010 Personal history of colonic polyps: Secondary | ICD-10-CM | POA: Diagnosis not present

## 2021-09-16 DIAGNOSIS — J309 Allergic rhinitis, unspecified: Secondary | ICD-10-CM | POA: Diagnosis not present

## 2021-09-16 DIAGNOSIS — R7303 Prediabetes: Secondary | ICD-10-CM | POA: Diagnosis not present

## 2021-09-16 DIAGNOSIS — Z0000 Encounter for general adult medical examination without abnormal findings: Secondary | ICD-10-CM | POA: Diagnosis not present

## 2021-09-16 DIAGNOSIS — Z01818 Encounter for other preprocedural examination: Secondary | ICD-10-CM | POA: Diagnosis not present

## 2021-09-16 DIAGNOSIS — I10 Essential (primary) hypertension: Secondary | ICD-10-CM | POA: Diagnosis not present

## 2021-09-16 DIAGNOSIS — K5790 Diverticulosis of intestine, part unspecified, without perforation or abscess without bleeding: Secondary | ICD-10-CM | POA: Diagnosis not present

## 2021-09-16 DIAGNOSIS — E039 Hypothyroidism, unspecified: Secondary | ICD-10-CM | POA: Diagnosis not present

## 2021-10-08 DIAGNOSIS — M1710 Unilateral primary osteoarthritis, unspecified knee: Secondary | ICD-10-CM | POA: Diagnosis not present

## 2021-10-08 DIAGNOSIS — R269 Unspecified abnormalities of gait and mobility: Secondary | ICD-10-CM | POA: Diagnosis not present

## 2021-10-08 DIAGNOSIS — I10 Essential (primary) hypertension: Secondary | ICD-10-CM | POA: Diagnosis not present

## 2021-11-10 ENCOUNTER — Other Ambulatory Visit: Payer: Self-pay | Admitting: Internal Medicine

## 2021-11-10 DIAGNOSIS — Z1231 Encounter for screening mammogram for malignant neoplasm of breast: Secondary | ICD-10-CM

## 2021-11-11 ENCOUNTER — Ambulatory Visit
Admission: RE | Admit: 2021-11-11 | Discharge: 2021-11-11 | Disposition: A | Discharge status: Home / Self Care | Payer: Medicare Other | Source: Ambulatory Visit | Attending: Internal Medicine | Admitting: Internal Medicine

## 2021-11-11 DIAGNOSIS — Z1231 Encounter for screening mammogram for malignant neoplasm of breast: Secondary | ICD-10-CM

## 2021-11-17 DIAGNOSIS — E039 Hypothyroidism, unspecified: Secondary | ICD-10-CM | POA: Diagnosis not present

## 2021-11-17 DIAGNOSIS — R7303 Prediabetes: Secondary | ICD-10-CM | POA: Diagnosis not present

## 2021-11-17 DIAGNOSIS — I10 Essential (primary) hypertension: Secondary | ICD-10-CM | POA: Diagnosis not present

## 2021-11-18 DIAGNOSIS — Z86010 Personal history of colonic polyps: Secondary | ICD-10-CM | POA: Diagnosis not present

## 2021-11-18 DIAGNOSIS — K5900 Constipation, unspecified: Secondary | ICD-10-CM | POA: Diagnosis not present

## 2021-11-18 DIAGNOSIS — M1710 Unilateral primary osteoarthritis, unspecified knee: Secondary | ICD-10-CM | POA: Diagnosis not present

## 2021-11-24 DIAGNOSIS — M1712 Unilateral primary osteoarthritis, left knee: Secondary | ICD-10-CM | POA: Diagnosis not present

## 2021-11-24 NOTE — H&P (Addendum)
TOTAL KNEE ADMISSION H&P  Patient is being admitted for left total knee arthroplasty.  Subjective:  Chief Complaint: Left knee pain.  HPI: Monica Mcintyre [id number], 71 y.o. female has a history of pain and functional disability in the left knee due to arthritis and has failed non-surgical conservative treatments for greater than 12 weeks to include NSAID's and/or analgesics and activity modification. Onset of symptoms was gradual, starting  several  years ago with gradually worsening course since that time. The patient noted no past surgery on the left knee.  Patient currently rates pain in the left knee at 7 out of 10 with activity. Patient has night pain, worsening of pain with activity and weight bearing, pain with passive range of motion, and crepitus. Patient has evidence of  bone-on-bone arthritis in the medial and patellofemoral compartments of the left knee, severe in nature, with significant varus and tibial subluxation.  by imaging studies. There is no active infection.  Patient Active Problem List   Diagnosis Date Noted   Bilateral carpal tunnel syndrome 01/19/2012    Past Medical History:  Diagnosis Date   Diverticulosis    Hypertension    Thyroid disease     Past Surgical History:  Procedure Laterality Date   ABDOMINAL HYSTERECTOMY     KNEE ARTHROSCOPY     TONSILLECTOMY      Prior to Admission medications   Medication Sig Start Date End Date Taking? Authorizing Provider  cholecalciferol (VITAMIN D3) 25 MCG (1000 UT) tablet Take 4,000 Units by mouth daily.    [provider]  Cod Liver Oil 1000 MG CAPS Take by mouth.    [provider]  ibuprofen (ADVIL,MOTRIN) 600 MG tablet Take 600 mg by mouth every 8 (eight) hours as needed.    [provider]  levothyroxine (SYNTHROID, LEVOTHROID) 75 MCG tablet Take 75 mcg by mouth daily.    [provider]  magnesium 30 MG tablet Take 30 mg by mouth daily.    [provider]  Multiple  Vitamin (MULTIVITAMIN) tablet Take 1 tablet by mouth daily.    [provider]  Probiotic Product (PROBIOTIC-10 PO) Take by mouth.    [provider]    Allergies  Allergen Reactions   Codeine Nausea And Vomiting    Social History   Socioeconomic History   Marital status: Single    Spouse name: Not on file   Number of children: 1   Years of education: Not on file   Highest education level: Not on file  Occupational History    Employer: [employer]  Tobacco Use   Smoking status: Never   Smokeless tobacco: Never  Vaping Use   Vaping Use: Never used  Substance and Sexual Activity   Alcohol use: No   Drug use: No   Sexual activity: Not on file  Other Topics Concern   Not on file  Social History Narrative   Patient is right-handed. She lives alone in a 2nd floor apartment. She drinks 1 cup of coffee a day. She participates in water aeorbics 3-5 x a week.   Social Determinants of Health   Financial Resource Strain: Not on file  Food Insecurity: Not on file  Transportation Needs: Not on file  Physical Activity: Not on file  Stress: Not on file  Social Connections: Not on file  Intimate Partner Violence: Not on file    Tobacco Use: Low Risk    Smoking Tobacco Use: Never   Smokeless Tobacco Use: Never  Passive Exposure: Not on file   Social History   Substance and Sexual Activity  Alcohol Use No    Family History  Problem Relation Age of Onset   Hyperlipidemia Mother    Hypertension Mother    Diabetes Neg Hx    Heart attack Neg Hx    Sudden death Neg Hx     Review of Systems  Constitutional:  Negative for chills and fever.  HENT:  Negative for congestion, sore throat and tinnitus.   Eyes:  Negative for double vision, photophobia and pain.  Respiratory:  Negative for cough, shortness of breath and wheezing.   Cardiovascular:  Negative for chest pain, palpitations and orthopnea.  Gastrointestinal:  Negative for heartburn, nausea  and vomiting.  Genitourinary:  Negative for dysuria, frequency and urgency.  Musculoskeletal:  Positive for joint pain.  Neurological:  Negative for dizziness, weakness and headaches.   Objective:  Physical Exam: Well nourished and well developed.  General: Alert and oriented x3, cooperative and pleasant, no acute distress.  Head: normocephalic, atraumatic, neck supple.  Eyes: EOMI.  Musculoskeletal:  Left Knee Exam:   Slight varus deformity.   No effusion present. No swelling present.   The Range of motion is: 5 to 110 degrees.   Moderate crepitus on range of motion of the knee.   Positive medial greater than lateral joint line tenderness.   The knee is stable.   Pseudolaxity with valgus stressing. No AP laxity.   Calves soft and nontender. Motor function intact in LE. Strength 5/5 LE bilaterally. Neuro: Distal pulses 2+. Sensation to light touch intact in LE.   Imaging Review Plain radiographs demonstrate severe degenerative joint disease of the left knee. The overall alignment is mild varus. The bone quality appears to be adequate for age and reported activity level.  Assessment/Plan:  End stage arthritis, left knee   The patient history, physical examination, clinical judgment of the provider and imaging studies are consistent with end stage degenerative joint disease of the left knee and total knee arthroplasty is deemed medically necessary. The treatment options including medical management, injection therapy arthroscopy and arthroplasty were discussed at length. The risks and benefits of total knee arthroplasty were presented and reviewed. The risks due to aseptic loosening, infection, stiffness, patella tracking problems, thromboembolic complications and other imponderables were discussed. The patient acknowledged the explanation, agreed to proceed with the plan and consent was signed. Patient is being admitted for inpatient treatment for surgery, pain control, PT, OT,  prophylactic antibiotics, VTE prophylaxis, progressive ambulation and ADLs and discharge planning. The patient is planning to be discharged  home .   Patient's anticipated LOS is less than 2 midnights, meeting these requirements: - Lives within 1 hour of care - Has a competent adult at home to recover with post-op recover - NO history of  - Chronic pain requiring opiods  - Diabetes  - Coronary Artery Disease  - Heart failure  - Heart attack  - Stroke  - DVT/VTE  - Cardiac arrhythmia  - Respiratory Failure/COPD  - Renal failure  - Anemia  - Advanced Liver disease  Therapy Plans: Outpatient therapy at [facility] Disposition: Home with daughter Planned DVT Prophylaxis: Aspirin 325 mg BID DME Needed: [facility] Rile PCP: Leeroy Cha, MD (clearance received) TXA: IV Allergies: Codeine Anesthesia Concerns: Difficulty awakening following colonoscopy BMI: 32.1 Last HgbA1c: Not diabetic Pharmacy: [facility]  Other: - Nausea with codeine, but has tolerated Norco. Discussed oxycodone.   - Patient was instructed on what medications to  stop prior to surgery. - Follow-up visit in 2 weeks with Dr. Wynelle Link - Begin physical therapy following surgery - Pre-operative lab work as pre-surgical testing - Prescriptions will be provided in hospital at time of discharge  Theresa Duty, PA-C Orthopedic Surgery [facility] [place]

## 2021-12-08 DIAGNOSIS — K5730 Diverticulosis of large intestine without perforation or abscess without bleeding: Secondary | ICD-10-CM | POA: Diagnosis not present

## 2021-12-08 DIAGNOSIS — Z86010 Personal history of colonic polyps: Secondary | ICD-10-CM | POA: Diagnosis not present

## 2021-12-08 NOTE — Patient Instructions (Signed)
DUE TO COVID-19 ONLY ONE VISITOR IS ALLOWED TO COME WITH YOU AND STAY IN THE WAITING ROOM ONLY DURING PRE OP AND PROCEDURE.   **NO VISITORS ARE ALLOWED IN THE SHORT STAY AREA OR RECOVERY ROOM!!**  IF YOU WILL BE ADMITTED INTO THE HOSPITAL YOU ARE ALLOWED ONLY TWO SUPPORT PEOPLE DURING VISITATION HOURS ONLY (7 AM -8PM)   The support person(s) must pass our screening, gel in and out, and wear a mask at all times, including in the patients room. Patients must also wear a mask when staff or their support person are in the room. Visitors GUEST BADGE MUST BE WORN VISIBLY  One adult visitor may remain with you overnight and MUST be in the room by 8 P.M.  No visitors under the age of 92. Any visitor under the age of 60 must be accompanied by an adult.    COVID SWAB TESTING MUST BE COMPLETED ON:  12/17/21   Site: [facility] [place] [place] Enter: Main Entrance have a seat in the waiting area to the right of main entrance (DO NOT [place]!!!!!) Dial: [phone] to alert staff you have arrived  You are not required to quarantine, however you are required to wear a well-fitted mask when you are out and around people not in your household.  Hand Hygiene often Do NOT share personal items Notify your provider if you are in close contact with someone who has COVID or you develop fever 100.4 or greater, new onset of sneezing, cough, sore throat, shortness of breath or body aches.  [address], [address], must go inside of the hospital, NOT A DRIVE THRU!  (Must self quarantine after testing. Follow instructions on handout.)       Your procedure is scheduled on: 12/21/21   Report to [facility] Main Entrance    Report to Short Stay at : 5:15 AM   Call this number if you have problems the morning of surgery [phone]   Do not eat food :After Midnight.   May have liquids until : 4:15  AM   day of surgery  CLEAR LIQUID DIET  Foods Allowed                                                                     Foods Excluded  Water, Black Coffee and tea, regular and decaf                             liquids that you cannot  Plain Jell-O in any flavor  (No red)                                           see through such as: Fruit ices (not with fruit pulp)                                     milk, soups, orange juice  Iced Popsicles (No red)                                    All solid food                                   Apple juices Sports drinks like Gatorade (No red) Lightly seasoned clear broth or consume(fat free) Sugar Sample Menu Breakfast                                Lunch                                     Supper Cranberry juice                    Beef broth                            Chicken broth Jell-O                                     Grape juice                           Apple juice Coffee or tea                        Jell-O                                      Popsicle                                                Coffee or tea                        Coffee or tea    Complete one Ensure drink the morning of surgery 3 hours prior to scheduled surgery at: 4:15 AM.    The day of surgery:  Drink ONE (1) Pre-Surgery Clear Ensure or G2 at AM the morning of surgery. Drink in one sitting. Do not sip.  This drink was given to you during your hospital  pre-op appointment visit. Nothing else to drink after completing the  Pre-Surgery Clear Ensure or G2.          If you have questions, please contact your surgeons    Oral Hygiene is also important to reduce your risk of infection.                                    Remember - BRUSH YOUR TEETH THE MORNING OF SURGERY WITH YOUR REGULAR TOOTHPASTE   Do NOT smoke after Midnight   Take these medicines the morning of surgery with A SIP OF WATER:  escitalopram,synthroid  DO NOT TAKE ANY ORAL DIABETIC  MEDICATIONS DAY OF YOUR SURGERY                              You may not have any metal on your body including hair pins, jewelry, and body piercing             Do not wear make-up, lotions, powders, perfumes/cologne, or deodorant  Do not wear nail polish including gel and S&S, artificial/acrylic nails, or any other type of covering on natural nails including finger and toenails. If you have artificial nails, gel coating, etc. that needs to be removed by a nail salon please have this removed prior to surgery or surgery may need to be canceled/ delayed if the surgeon/ anesthesia feels like they are unable to be safely monitored.   Do not shave  48 hours prior to surgery.    Do not bring valuables to the hospital. [place].   Contacts, dentures or bridgework may not be worn into surgery.   Bring small overnight bag day of surgery.    Patients discharged on the day of surgery will not be allowed to drive home.  Someone needs to stay with you for the first 24 hours after anesthesia.   Special Instructions: Bring a copy of your healthcare power of attorney and living will documents         the day of surgery if you haven't scanned them before.              Please read over the following fact sheets you were given: IF YOU HAVE QUESTIONS ABOUT YOUR PRE-OP INSTRUCTIONS PLEASE CALL [phone]     [facility] - Preparing for Surgery Before surgery, you can play an important role.  Because skin is not sterile, your skin needs to be as free of germs as possible.  You can reduce the number of germs on your skin by washing with CHG (chlorahexidine gluconate) soap before surgery.  CHG is an antiseptic cleaner which kills germs and bonds with the skin to continue killing germs even after washing. Please DO NOT use if you have an allergy to CHG or antibacterial soaps.  If your skin becomes reddened/irritated stop using the CHG and inform your nurse when you  arrive at Short Stay. Do not shave (including legs and underarms) for at least 48 hours prior to the first CHG shower.  You may shave your face/neck. Please follow these instructions carefully:  1.  Shower with CHG Soap the night before surgery and the  morning of Surgery.  2.  If you choose to wash your hair, wash your hair first as usual with your  normal  shampoo.  3.  After you shampoo, rinse your hair and body thoroughly to remove the  shampoo.                           4.  Use CHG as you would any other liquid soap.  You can apply chg directly  to the skin and wash                       Gently with a scrungie or clean washcloth.  5.  Apply the CHG Soap to your body ONLY FROM THE  NECK DOWN.   Do not use on face/ open                           Wound or open sores. Avoid contact with eyes, ears mouth and genitals (private parts).                       Wash face,  Genitals (private parts) with your normal soap.             6.  Wash thoroughly, paying special attention to the area where your surgery  will be performed.  7.  Thoroughly rinse your body with warm water from the neck down.  8.  DO NOT shower/wash with your normal soap after using and rinsing off  the CHG Soap.                9.  Pat yourself dry with a clean towel.            10.  Wear clean pajamas.            11.  Place clean sheets on your bed the night of your first shower and do not  sleep with pets. Day of Surgery : Do not apply any lotions/deodorants the morning of surgery.  Please wear clean clothes to the hospital/surgery center.  FAILURE TO FOLLOW THESE INSTRUCTIONS MAY RESULT IN THE CANCELLATION OF YOUR SURGERY PATIENT SIGNATURE_________________________________  NURSE SIGNATURE__________________________________  ________________________________________________________________________   Adam Phenix  An incentive spirometer is a tool that can help keep your lungs clear and active. This tool measures how  well you are filling your lungs with each breath. Taking long deep breaths may help reverse or decrease the chance of developing breathing (pulmonary) problems (especially infection) following: A long period of time when you are unable to move or be active. BEFORE THE PROCEDURE  If the spirometer includes an indicator to show your best effort, your nurse or respiratory therapist will set it to a desired goal. If possible, sit up straight or lean slightly forward. Try not to slouch. Hold the incentive spirometer in an upright position. INSTRUCTIONS FOR USE  Sit on the edge of your bed if possible, or sit up as far as you can in bed or on a chair. Hold the incentive spirometer in an upright position. Breathe out normally. Place the mouthpiece in your mouth and seal your lips tightly around it. Breathe in slowly and as deeply as possible, raising the piston or the ball toward the top of the column. Hold your breath for 3-5 seconds or for as long as possible. Allow the piston or ball to fall to the bottom of the column. Remove the mouthpiece from your mouth and breathe out normally. Rest for a few seconds and repeat Steps 1 through 7 at least 10 times every 1-2 hours when you are awake. Take your time and take a few normal breaths between deep breaths. The spirometer may include an indicator to show your best effort. Use the indicator as a goal to work toward during each repetition. After each set of 10 deep breaths, practice coughing to be sure your lungs are clear. If you have an incision (the cut made at the time of surgery), support your incision when coughing by placing a pillow or rolled up towels firmly against it. Once you are able to get out of bed, walk around indoors and cough well. You may stop  using the incentive spirometer when instructed by your caregiver.  RISKS AND COMPLICATIONS Take your time so you do not get dizzy or light-headed. If you are in pain, you may need to take or ask  for pain medication before doing incentive spirometry. It is harder to take a deep breath if you are having pain. AFTER USE Rest and breathe slowly and easily. It can be helpful to keep track of a log of your progress. Your caregiver can provide you with a simple table to help with this. If you are using the spirometer at home, follow these instructions: [place] IF:  You are having difficultly using the spirometer. You have trouble using the spirometer as often as instructed. Your pain medication is not giving enough relief while using the spirometer. You develop fever of 100.5 F (38.1 C) or higher. SEEK IMMEDIATE MEDICAL CARE IF:  You cough up bloody sputum that had not been present before. You develop fever of 102 F (38.9 C) or greater. You develop worsening pain at or near the incision site. MAKE SURE YOU:  Understand these instructions. Will watch your condition. Will get help right away if you are not doing well or get worse. Document Released: 02/28/2007 Document Revised: 01/10/2012 Document Reviewed: 05/01/2007 [facility] Patient Information 2014 [place], [place].   ________________________________________________________________________

## 2021-12-09 ENCOUNTER — Encounter (HOSPITAL_COMMUNITY): Payer: Self-pay

## 2021-12-09 ENCOUNTER — Encounter (HOSPITAL_COMMUNITY)
Admission: RE | Admit: 2021-12-09 | Discharge: 2021-12-09 | Disposition: A | Discharge status: Home / Self Care | Payer: Medicare Other | Source: Ambulatory Visit | Attending: Orthopedic Surgery | Admitting: Orthopedic Surgery

## 2021-12-09 ENCOUNTER — Other Ambulatory Visit: Payer: Self-pay

## 2021-12-09 VITALS — BP 136/72 | HR 61 | Temp 98.0°F | Ht 64.0 in | Wt 179.0 lb

## 2021-12-09 DIAGNOSIS — Z01818 Encounter for other preprocedural examination: Secondary | ICD-10-CM | POA: Insufficient documentation

## 2021-12-09 DIAGNOSIS — M1712 Unilateral primary osteoarthritis, left knee: Secondary | ICD-10-CM | POA: Insufficient documentation

## 2021-12-09 DIAGNOSIS — G5603 Carpal tunnel syndrome, bilateral upper limbs: Secondary | ICD-10-CM

## 2021-12-09 DIAGNOSIS — R7303 Prediabetes: Secondary | ICD-10-CM | POA: Insufficient documentation

## 2021-12-09 HISTORY — DX: Prediabetes: R73.03

## 2021-12-09 HISTORY — DX: Other complications of anesthesia, initial encounter: T88.59XA

## 2021-12-09 HISTORY — DX: Depression, unspecified: F32.A

## 2021-12-09 HISTORY — DX: Unspecified osteoarthritis, unspecified site: M19.90

## 2021-12-09 HISTORY — DX: Hypothyroidism, unspecified: E03.9

## 2021-12-09 LAB — COMPREHENSIVE METABOLIC PANEL
ALT: 15 U/L (ref 0–44)
AST: 21 U/L (ref 15–41)
Albumin: 3.7 g/dL (ref 3.5–5.0)
Alkaline Phosphatase: 87 U/L (ref 38–126)
Anion gap: 11 (ref 5–15)
BUN: 8 mg/dL (ref 8–23)
CO2: 26 mmol/L (ref 22–32)
Calcium: 8.7 mg/dL — ABNORMAL LOW (ref 8.9–10.3)
Chloride: 99 mmol/L (ref 98–111)
Creatinine, Ser: 0.63 mg/dL (ref 0.44–1.00)
GFR, Estimated: >60 mL/min (ref >60)
Glucose, Bld: 94 mg/dL (ref 70–99)
Potassium: 3.9 mmol/L (ref 3.5–5.1)
Sodium: 136 mmol/L (ref 135–145)
Total Bilirubin: 0.3 mg/dL (ref 0.3–1.2)
Total Protein: 7.2 g/dL (ref 6.5–8.1)

## 2021-12-09 LAB — SURGICAL PCR SCREEN
MRSA, PCR: NEGATIVE
Staphylococcus aureus: NEGATIVE

## 2021-12-09 LAB — CBC
HCT: 36.7 % (ref 36.0–46.0)
Hemoglobin: 11.5 g/dL — ABNORMAL LOW (ref 12.0–15.0)
MCH: 27.3 pg (ref 26.0–34.0)
MCHC: 31.3 g/dL (ref 30.0–36.0)
MCV: 87.2 fL (ref 80.0–100.0)
Platelets: 276 10*3/uL (ref 150–400)
RBC: 4.21 MIL/uL (ref 3.87–5.11)
RDW: 14 % (ref 11.5–15.5)
WBC: 3.9 10*3/uL — ABNORMAL LOW (ref 4.0–10.5)
nRBC: 0 % (ref 0.0–0.2)

## 2021-12-09 LAB — TYPE AND SCREEN
ABO/RH(D): A POS
Antibody Screen: NEGATIVE

## 2021-12-09 LAB — PROTIME-INR
INR: 1.1 (ref 0.8–1.2)
Prothrombin Time: 13.8 seconds (ref 11.4–15.2)

## 2021-12-09 LAB — HEMOGLOBIN A1C
Hgb A1c MFr Bld: 5.4 % (ref 4.8–5.6)
Mean Plasma Glucose: 108.28 mg/dL

## 2021-12-09 LAB — GLUCOSE, CAPILLARY: Glucose-Capillary: 110 mg/dL — ABNORMAL HIGH (ref 70–99)

## 2021-12-09 NOTE — Progress Notes (Signed)
COVID Vaccine Completed: Yes Date COVID Vaccine completed: 06/05/21 COVID vaccine manufacturer: 4 -Pfizer    COVID Test: 12/17/21 @ 8:30 AM Bowel prep reminder: N/A  PCP - Ronie Spies Varadarajan: [place] 11/17/21 Cardiologist -   Chest x-ray -  EKG - 09/16/21 : Chart Stress Test -  ECHO -  Cardiac Cath -  Pacemaker/ICD device last checked:  Sleep Study -  CPAP -   Fasting Blood Sugar -  Checks Blood Sugar _____ times a day  Blood Thinner Instructions: Aspirin Instructions: Last Dose:  Anesthesia review: Hx: HTN,Pre-DIA  Patient denies shortness of breath, fever, cough and chest pain at PAT appointment   Patient verbalized understanding of instructions that were given to them at the PAT appointment. Patient was also instructed that they will need to review over the PAT instructions again at home before surgery.

## 2021-12-17 ENCOUNTER — Encounter (HOSPITAL_COMMUNITY)
Admission: RE | Admit: 2021-12-17 | Discharge: 2021-12-17 | Disposition: A | Discharge status: Home / Self Care | Payer: Medicare Other | Source: Ambulatory Visit | Attending: Orthopedic Surgery | Admitting: Orthopedic Surgery

## 2021-12-17 ENCOUNTER — Other Ambulatory Visit: Payer: Self-pay

## 2021-12-17 DIAGNOSIS — Z01812 Encounter for preprocedural laboratory examination: Secondary | ICD-10-CM | POA: Diagnosis not present

## 2021-12-17 DIAGNOSIS — Z20822 Contact with and (suspected) exposure to covid-19: Secondary | ICD-10-CM | POA: Diagnosis not present

## 2021-12-17 LAB — SARS CORONAVIRUS 2 (TAT 6-24 HRS): SARS Coronavirus 2: NEGATIVE

## 2021-12-20 NOTE — Anesthesia Preprocedure Evaluation (Addendum)
Anesthesia Evaluation  Patient identified by MRN, date of birth, ID band Patient awake    Reviewed: Allergy & Precautions, NPO status , Patient's Chart, lab work & pertinent test results  History of Anesthesia Complications (+) history of anesthetic complications  Airway Mallampati: II  TM Distance: >3 FB Neck ROM: Full    Dental  (+) Edentulous Upper, Missing, Dental Advisory Given   Pulmonary neg pulmonary ROS,    Pulmonary exam normal breath sounds clear to auscultation       Cardiovascular hypertension, Normal cardiovascular exam Rhythm:Regular Rate:Normal     Neuro/Psych PSYCHIATRIC DISORDERS Depression  Neuromuscular disease    GI/Hepatic negative GI ROS, Neg liver ROS,   Endo/Other  Hypothyroidism   Renal/GU negative Renal ROS     Musculoskeletal  (+) Arthritis ,   Abdominal (+) + obese,   Peds  Hematology negative hematology ROS (+)   Anesthesia Other Findings   Reproductive/Obstetrics                            Anesthesia Physical Anesthesia Plan  ASA: 2  Anesthesia Plan: Spinal   Post-op Pain Management: Celebrex PO (pre-op)*, Regional block* and Ofirmev IV (intra-op)*   Induction: Intravenous  PONV Risk Score and Plan: 3 and Dexamethasone, Propofol infusion, TIVA and Treatment may vary due to age or medical condition  Airway Management Planned: Natural Airway  Additional Equipment:   Intra-op Plan:   Post-operative Plan:   Informed Consent: I have reviewed the patients History and Physical, chart, labs and discussed the procedure including the risks, benefits and alternatives for the proposed anesthesia with the patient or authorized representative who has indicated his/her understanding and acceptance.     Dental advisory given  Plan Discussed with: CRNA  Anesthesia Plan Comments:       Anesthesia Quick Evaluation

## 2021-12-21 ENCOUNTER — Encounter (HOSPITAL_COMMUNITY)
Admission: RE | Disposition: A | Discharge status: Home / Self Care | Payer: Self-pay | Source: Home / Self Care | Attending: Orthopedic Surgery

## 2021-12-21 ENCOUNTER — Other Ambulatory Visit: Payer: Self-pay

## 2021-12-21 ENCOUNTER — Ambulatory Visit (HOSPITAL_COMMUNITY): Payer: Medicare Other | Admitting: Anesthesiology

## 2021-12-21 ENCOUNTER — Observation Stay (HOSPITAL_COMMUNITY)
Admission: RE | Admit: 2021-12-21 | Discharge: 2021-12-22 | Disposition: A | Discharge status: Home / Self Care | Payer: Medicare Other | Attending: Orthopedic Surgery | Admitting: Orthopedic Surgery

## 2021-12-21 ENCOUNTER — Encounter (HOSPITAL_COMMUNITY): Payer: Self-pay | Admitting: Orthopedic Surgery

## 2021-12-21 ENCOUNTER — Ambulatory Visit (HOSPITAL_BASED_OUTPATIENT_CLINIC_OR_DEPARTMENT_OTHER): Payer: Medicare Other | Admitting: Anesthesiology

## 2021-12-21 DIAGNOSIS — Z01818 Encounter for other preprocedural examination: Secondary | ICD-10-CM

## 2021-12-21 DIAGNOSIS — M179 Osteoarthritis of knee, unspecified: Secondary | ICD-10-CM | POA: Diagnosis present

## 2021-12-21 DIAGNOSIS — M1712 Unilateral primary osteoarthritis, left knee: Principal | ICD-10-CM | POA: Diagnosis present

## 2021-12-21 DIAGNOSIS — G8918 Other acute postprocedural pain: Secondary | ICD-10-CM | POA: Diagnosis not present

## 2021-12-21 DIAGNOSIS — I10 Essential (primary) hypertension: Secondary | ICD-10-CM | POA: Insufficient documentation

## 2021-12-21 DIAGNOSIS — Z79899 Other long term (current) drug therapy: Secondary | ICD-10-CM | POA: Insufficient documentation

## 2021-12-21 DIAGNOSIS — E039 Hypothyroidism, unspecified: Secondary | ICD-10-CM | POA: Diagnosis not present

## 2021-12-21 HISTORY — PX: TOTAL KNEE ARTHROPLASTY: SHX125

## 2021-12-21 LAB — ABO/RH: ABO/RH(D): A POS

## 2021-12-21 SURGERY — ARTHROPLASTY, KNEE, TOTAL
Anesthesia: Spinal | Site: Knee | Laterality: Left

## 2021-12-21 MED ORDER — SODIUM CHLORIDE 0.9 % IV SOLN: INTRAVENOUS | Status: DC

## 2021-12-21 MED ORDER — BUPIVACAINE LIPOSOME 1.3 % IJ SUSP: 20.0000 mL | Freq: Once | INTRAMUSCULAR | Status: AC

## 2021-12-21 MED ORDER — PHENYLEPHRINE HCL (PRESSORS) 10 MG/ML IV SOLN
INTRAVENOUS | Status: AC
  Filled 2021-12-21: qty 1

## 2021-12-21 MED ORDER — CHLORHEXIDINE GLUCONATE 0.12 % MT SOLN
15.0000 mL | Freq: Once | OROMUCOSAL | Status: AC
  Administered 2021-12-21: 15 mL via OROMUCOSAL

## 2021-12-21 MED ORDER — LACTATED RINGERS IV SOLN: INTRAVENOUS | Status: DC

## 2021-12-21 MED ORDER — FLEET ENEMA 7-19 GM/118ML RE ENEM: 1.0000 | ENEMA | Freq: Once | RECTAL | Status: DC | PRN

## 2021-12-21 MED ORDER — METHOCARBAMOL 500 MG PO TABS
500.0000 mg | ORAL_TABLET | Freq: Four times a day (QID) | ORAL | Status: DC | PRN
  Administered 2021-12-21 – 2021-12-22 (×2): 500 mg via ORAL
  Filled 2021-12-21 (×2): qty 1

## 2021-12-21 MED ORDER — MENTHOL 3 MG MT LOZG: 1.0000 | LOZENGE | OROMUCOSAL | Status: DC | PRN

## 2021-12-21 MED ORDER — LEVOTHYROXINE SODIUM 75 MCG PO TABS
75.0000 ug | ORAL_TABLET | Freq: Every day | ORAL | Status: DC
  Administered 2021-12-22: 75 ug via ORAL
  Filled 2021-12-21: qty 1

## 2021-12-21 MED ORDER — PROPOFOL 500 MG/50ML IV EMUL
INTRAVENOUS | Status: DC | PRN
  Administered 2021-12-21: 40 ug/kg/min via INTRAVENOUS

## 2021-12-21 MED ORDER — METOCLOPRAMIDE HCL 5 MG PO TABS: 5.0000 mg | ORAL_TABLET | Freq: Three times a day (TID) | ORAL | Status: DC | PRN

## 2021-12-21 MED ORDER — ONDANSETRON HCL 4 MG/2ML IJ SOLN: 4.0000 mg | Freq: Four times a day (QID) | INTRAMUSCULAR | Status: DC | PRN

## 2021-12-21 MED ORDER — PHENYLEPHRINE HCL-NACL 20-0.9 MG/250ML-% IV SOLN
INTRAVENOUS | Status: DC | PRN
  Administered 2021-12-21: 20 ug/min via INTRAVENOUS

## 2021-12-21 MED ORDER — POLYETHYLENE GLYCOL 3350 17 G PO PACK: 17.0000 g | PACK | Freq: Every day | ORAL | Status: DC | PRN

## 2021-12-21 MED ORDER — DOCUSATE SODIUM 100 MG PO CAPS
100.0000 mg | ORAL_CAPSULE | Freq: Two times a day (BID) | ORAL | Status: DC
  Administered 2021-12-21 – 2021-12-22 (×2): 100 mg via ORAL
  Filled 2021-12-21 (×2): qty 1

## 2021-12-21 MED ORDER — ONDANSETRON HCL 4 MG/2ML IJ SOLN
INTRAMUSCULAR | Status: AC
  Filled 2021-12-21: qty 2

## 2021-12-21 MED ORDER — ASPIRIN EC 325 MG PO TBEC
325.0000 mg | DELAYED_RELEASE_TABLET | Freq: Two times a day (BID) | ORAL | Status: DC
  Administered 2021-12-22: 325 mg via ORAL
  Filled 2021-12-21: qty 1

## 2021-12-21 MED ORDER — DEXAMETHASONE SODIUM PHOSPHATE 10 MG/ML IJ SOLN
8.0000 mg | Freq: Once | INTRAMUSCULAR | Status: AC
  Administered 2021-12-21: 8 mg via INTRAVENOUS

## 2021-12-21 MED ORDER — ACETAMINOPHEN 10 MG/ML IV SOLN
1000.0000 mg | Freq: Four times a day (QID) | INTRAVENOUS | Status: DC
  Administered 2021-12-21: 1000 mg via INTRAVENOUS
  Filled 2021-12-21: qty 100

## 2021-12-21 MED ORDER — PROMETHAZINE HCL 25 MG/ML IJ SOLN: 6.2500 mg | INTRAMUSCULAR | Status: DC | PRN

## 2021-12-21 MED ORDER — MIDAZOLAM HCL 5 MG/5ML IJ SOLN
INTRAMUSCULAR | Status: DC | PRN
  Administered 2021-12-21: 2 mg via INTRAVENOUS

## 2021-12-21 MED ORDER — ACETAMINOPHEN 500 MG PO TABS
1000.0000 mg | ORAL_TABLET | Freq: Four times a day (QID) | ORAL | Status: AC
  Administered 2021-12-21 – 2021-12-22 (×4): 1000 mg via ORAL
  Filled 2021-12-21 (×4): qty 2

## 2021-12-21 MED ORDER — PHENOL 1.4 % MT LIQD: 1.0000 | OROMUCOSAL | Status: DC | PRN

## 2021-12-21 MED ORDER — MEPERIDINE HCL 50 MG/ML IJ SOLN: 6.2500 mg | INTRAMUSCULAR | Status: DC | PRN

## 2021-12-21 MED ORDER — MAGNESIUM 100 MG PO CAPS: 100.0000 mg | ORAL_CAPSULE | Freq: Every day | ORAL | 0 refills | Status: AC

## 2021-12-21 MED ORDER — BUPIVACAINE LIPOSOME 1.3 % IJ SUSP
INTRAMUSCULAR | Status: DC | PRN
  Administered 2021-12-21: 20 mL

## 2021-12-21 MED ORDER — DIPHENHYDRAMINE HCL 12.5 MG/5ML PO ELIX: 12.5000 mg | ORAL_SOLUTION | ORAL | Status: DC | PRN

## 2021-12-21 MED ORDER — FENTANYL CITRATE (PF) 100 MCG/2ML IJ SOLN
INTRAMUSCULAR | Status: AC
  Filled 2021-12-21: qty 2

## 2021-12-21 MED ORDER — METOCLOPRAMIDE HCL 5 MG/ML IJ SOLN: 5.0000 mg | Freq: Three times a day (TID) | INTRAMUSCULAR | Status: DC | PRN

## 2021-12-21 MED ORDER — ONDANSETRON HCL 4 MG/2ML IJ SOLN
INTRAMUSCULAR | Status: DC | PRN
  Administered 2021-12-21: 4 mg via INTRAVENOUS

## 2021-12-21 MED ORDER — TRANEXAMIC ACID-NACL 1000-0.7 MG/100ML-% IV SOLN
1000.0000 mg | INTRAVENOUS | Status: AC
  Administered 2021-12-21: 1000 mg via INTRAVENOUS
  Filled 2021-12-21: qty 100

## 2021-12-21 MED ORDER — CELECOXIB 200 MG PO CAPS
200.0000 mg | ORAL_CAPSULE | Freq: Once | ORAL | Status: AC
  Administered 2021-12-21: 200 mg via ORAL
  Filled 2021-12-21: qty 1

## 2021-12-21 MED ORDER — BUPIVACAINE LIPOSOME 1.3 % IJ SUSP
INTRAMUSCULAR | Status: AC
  Filled 2021-12-21: qty 20

## 2021-12-21 MED ORDER — FENTANYL CITRATE (PF) 100 MCG/2ML IJ SOLN
INTRAMUSCULAR | Status: DC | PRN
  Administered 2021-12-21: 50 ug via INTRAVENOUS

## 2021-12-21 MED ORDER — ONDANSETRON HCL 4 MG PO TABS: 4.0000 mg | ORAL_TABLET | Freq: Four times a day (QID) | ORAL | Status: DC | PRN

## 2021-12-21 MED ORDER — BUPIVACAINE IN DEXTROSE 0.75-8.25 % IT SOLN
INTRATHECAL | Status: DC | PRN
  Administered 2021-12-21: 1.5 mL via INTRATHECAL

## 2021-12-21 MED ORDER — MORPHINE SULFATE (PF) 2 MG/ML IV SOLN: 0.5000 mg | INTRAVENOUS | Status: DC | PRN

## 2021-12-21 MED ORDER — OXYCODONE HCL 5 MG PO TABS
10.0000 mg | ORAL_TABLET | ORAL | Status: DC | PRN
  Administered 2021-12-21 – 2021-12-22 (×4): 10 mg via ORAL
  Filled 2021-12-21 (×4): qty 2

## 2021-12-21 MED ORDER — DEXAMETHASONE SODIUM PHOSPHATE 10 MG/ML IJ SOLN
INTRAMUSCULAR | Status: AC
  Filled 2021-12-21: qty 1

## 2021-12-21 MED ORDER — ORAL CARE MOUTH RINSE: 15.0000 mL | Freq: Once | OROMUCOSAL | Status: AC

## 2021-12-21 MED ORDER — ROPIVACAINE HCL 7.5 MG/ML IJ SOLN
INTRAMUSCULAR | Status: DC | PRN
  Administered 2021-12-21: 20 mL via PERINEURAL

## 2021-12-21 MED ORDER — CEFAZOLIN SODIUM-DEXTROSE 2-4 GM/100ML-% IV SOLN
2.0000 g | Freq: Four times a day (QID) | INTRAVENOUS | Status: AC
  Administered 2021-12-21 (×2): 2 g via INTRAVENOUS
  Filled 2021-12-21 (×2): qty 100

## 2021-12-21 MED ORDER — HYDROMORPHONE HCL 1 MG/ML IJ SOLN: 0.2500 mg | INTRAMUSCULAR | Status: DC | PRN

## 2021-12-21 MED ORDER — CEFAZOLIN SODIUM-DEXTROSE 2-4 GM/100ML-% IV SOLN
2.0000 g | INTRAVENOUS | Status: AC
  Administered 2021-12-21: 2 g via INTRAVENOUS
  Filled 2021-12-21: qty 100

## 2021-12-21 MED ORDER — ESCITALOPRAM OXALATE 10 MG PO TABS
10.0000 mg | ORAL_TABLET | Freq: Every day | ORAL | Status: DC
  Administered 2021-12-21 – 2021-12-22 (×2): 10 mg via ORAL
  Filled 2021-12-21 (×2): qty 1

## 2021-12-21 MED ORDER — BISACODYL 10 MG RE SUPP
10.0000 mg | Freq: Every day | RECTAL | Status: DC | PRN
Start: 2021-12-21 — End: 2021-12-22

## 2021-12-21 MED ORDER — METHOCARBAMOL 1000 MG/10ML IJ SOLN
500.0000 mg | Freq: Four times a day (QID) | INTRAVENOUS | Status: DC | PRN
  Filled 2021-12-21: qty 5

## 2021-12-21 MED ORDER — DEXAMETHASONE SODIUM PHOSPHATE 10 MG/ML IJ SOLN
10.0000 mg | Freq: Once | INTRAMUSCULAR | Status: AC
  Administered 2021-12-22: 10 mg via INTRAVENOUS
  Filled 2021-12-21: qty 1

## 2021-12-21 MED ORDER — SODIUM CHLORIDE 0.9 % IR SOLN
Status: DC | PRN
  Administered 2021-12-21 (×2): 1000 mL

## 2021-12-21 MED ORDER — DEXAMETHASONE SODIUM PHOSPHATE 4 MG/ML IJ SOLN
INTRAMUSCULAR | Status: DC | PRN
  Administered 2021-12-21: 3 mg via PERINEURAL

## 2021-12-21 MED ORDER — OXYCODONE HCL 5 MG PO TABS
5.0000 mg | ORAL_TABLET | ORAL | Status: DC | PRN
  Administered 2021-12-22: 5 mg via ORAL
  Filled 2021-12-21 (×2): qty 1

## 2021-12-21 MED ORDER — POVIDONE-IODINE 10 % EX SWAB
2.0000 "application " | Freq: Once | CUTANEOUS | Status: AC
  Administered 2021-12-21: 2 via TOPICAL

## 2021-12-21 MED ORDER — CLONIDINE HCL (ANALGESIA) 100 MCG/ML EP SOLN
EPIDURAL | Status: DC | PRN
  Administered 2021-12-21: 80 ug

## 2021-12-21 MED ORDER — SODIUM CHLORIDE (PF) 0.9 % IJ SOLN
INTRAMUSCULAR | Status: AC
  Filled 2021-12-21: qty 10

## 2021-12-21 MED ORDER — SODIUM CHLORIDE (PF) 0.9 % IJ SOLN
INTRAMUSCULAR | Status: DC | PRN
  Administered 2021-12-21: 60 mL

## 2021-12-21 MED ORDER — PROPOFOL 1000 MG/100ML IV EMUL
INTRAVENOUS | Status: AC
  Filled 2021-12-21: qty 100

## 2021-12-21 MED ORDER — MIDAZOLAM HCL 2 MG/2ML IJ SOLN
INTRAMUSCULAR | Status: AC
  Filled 2021-12-21: qty 2

## 2021-12-21 MED ORDER — GABAPENTIN 300 MG PO CAPS
300.0000 mg | ORAL_CAPSULE | Freq: Three times a day (TID) | ORAL | Status: DC
  Administered 2021-12-21 – 2021-12-22 (×3): 300 mg via ORAL
  Filled 2021-12-21 (×3): qty 1

## 2021-12-21 SURGICAL SUPPLY — 53 items
ATTUNE MED DOME PAT 38 KNEE (Knees) ×1 IMPLANT
ATTUNE PSFEM LTSZ6 NARCEM KNEE (Femur) ×1 IMPLANT
ATTUNE PSRP INSR SZ6 8 KNEE (Insert) ×1 IMPLANT
BAG COUNTER SPONGE SURGICOUNT (BAG) ×1 IMPLANT
BAG DECANTER FOR FLEXI CONT (MISCELLANEOUS) ×1 IMPLANT
BAG ZIPLOCK 12X15 (MISCELLANEOUS) ×2 IMPLANT
BASE TIBIAL ROT PLAT SZ 5 KNEE (Knees) IMPLANT
BLADE SAG 18X100X1.27 (BLADE) ×2 IMPLANT
BLADE SAW SGTL 11.0X1.19X90.0M (BLADE) ×2 IMPLANT
BNDG ELASTIC 6X5.8 VLCR STR LF (GAUZE/BANDAGES/DRESSINGS) ×2 IMPLANT
BOWL SMART MIX CTS (DISPOSABLE) ×2 IMPLANT
CEMENT HV SMART SET (Cement) ×4 IMPLANT
COVER SURGICAL LIGHT HANDLE (MISCELLANEOUS) ×2 IMPLANT
CUFF TOURN SGL QUICK 34 (TOURNIQUET CUFF) ×1
CUFF TRNQT CYL 34X4.125X (TOURNIQUET CUFF) ×1 IMPLANT
DRAPE INCISE IOBAN 66X45 STRL (DRAPES) ×2 IMPLANT
DRAPE U-SHAPE 47X51 STRL (DRAPES) ×2 IMPLANT
DRSG AQUACEL AG ADV 3.5X10 (GAUZE/BANDAGES/DRESSINGS) ×2 IMPLANT
DURAPREP 26ML APPLICATOR (WOUND CARE) ×2 IMPLANT
ELECT REM PT RETURN 15FT ADLT (MISCELLANEOUS) ×2 IMPLANT
GLOVE SRG 8 PF TXTR STRL LF DI (GLOVE) ×1 IMPLANT
GLOVE SURG ENC MOIS LTX SZ6.5 (GLOVE) ×2 IMPLANT
GLOVE SURG ENC MOIS LTX SZ8 (GLOVE) ×4 IMPLANT
GLOVE SURG UNDER POLY LF SZ7 (GLOVE) ×2 IMPLANT
GLOVE SURG UNDER POLY LF SZ8 (GLOVE) ×2
GLOVE SURG UNDER POLY LF SZ8.5 (GLOVE) ×2 IMPLANT
GOWN STRL REUS W/TWL LRG LVL3 (GOWN DISPOSABLE) ×2 IMPLANT
GOWN STRL REUS W/TWL XL LVL3 (GOWN DISPOSABLE) IMPLANT
HANDPIECE INTERPULSE COAX TIP (DISPOSABLE) ×2
HOLDER FOLEY CATH W/STRAP (MISCELLANEOUS) ×1 IMPLANT
IMMOBILIZER KNEE 20 (SOFTGOODS) ×2
IMMOBILIZER KNEE 20 THIGH 36 (SOFTGOODS) ×1 IMPLANT
KIT TURNOVER KIT A (KITS) IMPLANT
MANIFOLD NEPTUNE II (INSTRUMENTS) ×2 IMPLANT
NS IRRIG 1000ML POUR BTL (IV SOLUTION) ×2 IMPLANT
PACK TOTAL KNEE CUSTOM (KITS) ×2 IMPLANT
PADDING CAST COTTON 6X4 STRL (CAST SUPPLIES) ×3 IMPLANT
PROTECTOR NERVE ULNAR (MISCELLANEOUS) ×2 IMPLANT
SET HNDPC FAN SPRY TIP SCT (DISPOSABLE) ×1 IMPLANT
SPIKE FLUID TRANSFER (MISCELLANEOUS) ×1 IMPLANT
SPONGE T-LAP 18X18 ~~LOC~~+RFID (SPONGE) ×4 IMPLANT
STRIP CLOSURE SKIN 1/2X4 (GAUZE/BANDAGES/DRESSINGS) ×4 IMPLANT
SUT MNCRL AB 4-0 PS2 18 (SUTURE) ×2 IMPLANT
SUT STRATAFIX 0 PDS 27 VIOLET (SUTURE) ×2
SUT VIC AB 2-0 CT1 27 (SUTURE) ×6
SUT VIC AB 2-0 CT1 TAPERPNT 27 (SUTURE) ×3 IMPLANT
SUTURE STRATFX 0 PDS 27 VIOLET (SUTURE) ×1 IMPLANT
TIBIAL BASE ROT PLAT SZ 5 KNEE (Knees) ×2 IMPLANT
TRAY FOLEY MTR SLVR 14FR STAT (SET/KITS/TRAYS/PACK) ×1 IMPLANT
TRAY FOLEY MTR SLVR 16FR STAT (SET/KITS/TRAYS/PACK) ×1 IMPLANT
TUBE SUCTION HIGH CAP CLEAR NV (SUCTIONS) ×2 IMPLANT
WATER STERILE IRR 1000ML POUR (IV SOLUTION) ×4 IMPLANT
WRAP KNEE MAXI GEL POST OP (GAUZE/BANDAGES/DRESSINGS) ×2 IMPLANT

## 2021-12-21 NOTE — Anesthesia Procedure Notes (Addendum)
Spinal  Patient location during procedure: OR Start time: 12/21/2021 7:12 AM End time: 12/21/2021 7:20 AM Reason for block: surgical anesthesia Staffing Performed: anesthesiologist  Anesthesiologist: Nolon Nations, MD Resident/CRNA: Victoriano Lain, CRNA Preanesthetic Checklist Completed: patient identified, IV checked, site marked, risks and benefits discussed, surgical consent, monitors and equipment checked, pre-op evaluation and timeout performed Spinal Block Prep: DuraPrep and site prepped and draped Patient monitoring: heart rate, continuous pulse ox and blood pressure Approach: midline Location: L3-4 Injection technique: single-shot Needle Needle type: Spinocan  Needle gauge: 25 G Needle length: 9 cm Assessment Events: second provider Additional Notes Expiration date of kit checked and confirmed. Patient tolerated procedure well, without complications.

## 2021-12-21 NOTE — Evaluation (Signed)
Physical Therapy Evaluation Patient Details Name: Monica Mcintyre MRN: [id number] DOB: 07-Mar-1951 Today's Date: 12/21/2021  History of Present Illness  71 yo female s/p  L TKA. PMH: HTN, carpal tunnel  Clinical Impression  Pt is s/p TKA resulting in the deficits listed below (see PT Problem List).  PT doing exceptionally well today. Amb ~ 24' with RW and min-guard assist. Anticipate steady progress ina cute setting, HEP initiated   Pt will benefit from skilled PT to increase their independence and safety with mobility to allow discharge to the venue listed below.         Recommendations for follow up therapy are one component of a multi-disciplinary discharge planning process, led by the attending physician.  Recommendations may be updated based on patient status, additional functional criteria and insurance authorization.  Follow Up Recommendations Follow physician's recommendations for discharge plan and follow up therapies    Assistance Recommended at Discharge Intermittent Supervision/Assistance  Patient can return home with the following  Help with stairs or ramp for entrance;Assist for transportation    Equipment Recommendations Rolling walker (2 wheels)  Recommendations for Other Services       Functional Status Assessment Patient has had a recent decline in their functional status and demonstrates the ability to make significant improvements in function in a reasonable and predictable amount of time.     Precautions / Restrictions Precautions Precautions: Fall;Knee Restrictions Weight Bearing Restrictions: No      Mobility  Bed Mobility Overal bed mobility: Needs Assistance Bed Mobility: Supine to Sit     Supine to sit: Min guard     General bed mobility comments: for safety    Transfers Overall transfer level: Needs assistance Equipment used: Rolling walker (2 wheels) Transfers: Sit to/from Stand Sit to Stand: Min guard           General  transfer comment: cues for hand placement and LLE positioning    Ambulation/Gait Ambulation/Gait assistance: Min guard, Min assist Gait Distance (Feet): 80 Feet Assistive device: Rolling walker (2 wheels) Gait Pattern/deviations: Step-to pattern       General Gait Details: cues for sequence, RW position, placing incr  wt on LLE as tolerated  Stairs            Wheelchair Mobility    Modified Rankin (Stroke Patients Only)       Balance                                             Pertinent Vitals/Pain Pain Assessment Pain Assessment: 0-10 Pain Score: 3  Pain Location: left knee Pain Descriptors / Indicators: Aching, Grimacing Pain Intervention(s): Repositioned, Premedicated before session, Limited activity within patient's tolerance, Monitored during session, Ice applied    Home Living Family/patient expects to be discharged to:: Private residence Living Arrangements: Children Available Help at Discharge: Family Type of Home: House Home Access: Stairs to enter   Technical brewer of Steps: 1   Home Layout: Two level;Able to live on main level with bedroom/bathroom Home Equipment: Kasandra Knudsen - single point      Prior Function Prior Level of Function : Independent/Modified Independent                     Hand Dominance        Extremity/Trunk Assessment   Upper Extremity Assessment Upper Extremity Assessment: Overall WFL for tasks  assessed    Lower Extremity Assessment Lower Extremity Assessment: LLE deficits/detail LLE Deficits / Details: ankle WFL, knee extension and hip flexion 3/5, no notable quad lag       Communication   Communication: No difficulties  Cognition                                                General Comments      Exercises Total Joint Exercises Ankle Circles/Pumps: AROM, Both, 10 reps Quad Sets: 5 reps, Both, AROM Straight Leg Raises: AROM, 5 reps, Left   Assessment/Plan     PT Assessment Patient needs continued PT services  PT Problem List Decreased strength;Decreased mobility;Decreased range of motion;Decreased activity tolerance;Pain;Decreased knowledge of use of DME;Decreased knowledge of precautions       PT Treatment Interventions DME instruction;Therapeutic exercise;Gait training;Functional mobility training;Therapeutic activities;Patient/family education;Stair training    PT Goals (Current goals can be found in the Care Plan section)  Acute Rehab PT Goals Patient Stated Goal: get better PT Goal Formulation: With patient Time For Goal Achievement: 12/28/21    Frequency 7X/week     Co-evaluation               AM-PAC PT "6 Clicks" Mobility  Outcome Measure Help needed turning from your back to your side while in a flat bed without using bedrails?: A Little Help needed moving from lying on your back to sitting on the side of a flat bed without using bedrails?: A Little Help needed moving to and from a bed to a chair (including a wheelchair)?: A Little Help needed standing up from a chair using your arms (e.g., wheelchair or bedside chair)?: A Little Help needed to walk in hospital room?: A Little Help needed climbing 3-5 steps with a railing? : A Little 6 Click Score: 18    End of Session Equipment Utilized During Treatment: Gait belt Activity Tolerance: Patient tolerated treatment well Patient left: in chair;with call bell/phone within reach;with chair alarm set Nurse Communication: Mobility status PT Visit Diagnosis: Difficulty in walking, not elsewhere classified (R26.2)    Time: 6415-8309 PT Time Calculation (min) (ACUTE ONLY): 31 min   Charges:   PT Evaluation $PT Eval Low Complexity: 1 Low PT Treatments $Gait Training: 8-22 mins        Baxter Flattery, PT  Acute Rehab Dept ([place]) [phone] Pager [phone]  12/21/2021   [facility] 12/21/2021, 4:15 PM

## 2021-12-21 NOTE — Interval H&P Note (Signed)
History and Physical Interval Note:  12/21/2021 6:26 AM  Monica Mcintyre  has presented today for surgery, with the diagnosis of left knee osteoarthritis.  The various methods of treatment have been discussed with the patient and family. After consideration of risks, benefits and other options for treatment, the patient has consented to  Procedure(s): TOTAL KNEE ARTHROPLASTY (Left) as a surgical intervention.  The patient's history has been reviewed, patient examined, no change in status, stable for surgery.  I have reviewed the patient's chart and labs.  Questions were answered to the patient's satisfaction.     Pilar Plate Oskar Cretella

## 2021-12-21 NOTE — Care Plan (Signed)
Ortho Bundle Case Management Note  Patient Details  Name: Monica Mcintyre MRN: [id number] Date of Birth: Nov 13, 1950  L TKA on 12-21-21 DCP:  Home with dtr DME:  RW ordered through [place] PT:  EO on 12-24-21.                   DME Arranged:  Gilford Rile rolling DME Agency:  [employer]  HH Arranged:  NA [place] Agency:  NA  Additional Comments: Please contact me with any questions of if this plan should need to change.  Marianne Sofia, RN,CCM [facility]  [phone] 12/21/2021, 2:02 PM

## 2021-12-21 NOTE — Anesthesia Postprocedure Evaluation (Signed)
Anesthesia Post Note  Patient: Monica Mcintyre  Procedure(s) Performed: TOTAL KNEE ARTHROPLASTY (Left: Knee)     Patient location during evaluation: PACU Anesthesia Type: Spinal Level of consciousness: awake and alert Pain management: pain level controlled Vital Signs Assessment: post-procedure vital signs reviewed and stable Respiratory status: spontaneous breathing Cardiovascular status: stable Anesthetic complications: no   No notable events documented.  Last Vitals:  Vitals:   12/21/21 1715 12/21/21 2102  BP: 119/64 119/64  Pulse: (!) 57 62  Resp: 14 17  Temp: 36.6 C 36.6 C  SpO2: 99% 100%    Last Pain:  Vitals:   12/21/21 1810  TempSrc:   PainSc: [place]

## 2021-12-21 NOTE — Discharge Instructions (Signed)
 Frank Aluisio, MD Total Joint Specialist [facility] [address] [place], [place] [address] [phone]  TOTAL KNEE REPLACEMENT POSTOPERATIVE DIRECTIONS    Knee Rehabilitation, Guidelines Following Surgery  Results after knee surgery are often greatly improved when you follow the exercise, range of motion and muscle strengthening exercises prescribed by your doctor. Safety measures are also important to protect the knee from further injury. If any of these exercises cause you to have increased pain or swelling in your knee joint, decrease the amount until you are comfortable again and slowly increase them. If you have problems or questions, call your caregiver or physical therapist for advice.   BLOOD CLOT PREVENTION Take a 325 mg Aspirin two times a day for three weeks following surgery. Then take an 81 mg Aspirin once a day for three weeks. Then discontinue Aspirin. You may resume your vitamins/supplements upon discharge from the hospital. Do not take any NSAIDs (Advil, Aleve, Ibuprofen, Meloxicam, etc.) until you have discontinued the 325 mg Aspirin.  HOME CARE INSTRUCTIONS  Remove items at home which could result in a fall. This includes throw rugs or furniture in walking pathways.  ICE to the affected knee as much as tolerated. Icing helps control swelling. If the swelling is well controlled you will be more comfortable and rehab easier. Continue to use ice on the knee for pain and swelling from surgery. You may notice swelling that will progress down to the foot and ankle. This is normal after surgery. Elevate the leg when you are not up walking on it.    Continue to use the breathing machine which will help keep your temperature down. It is common for your temperature to cycle up and down following surgery, especially at night when you are not up moving around and exerting yourself. The breathing machine keeps your lungs expanded and your temperature  down. Do not place pillow under the operative knee, focus on keeping the knee straight while resting  DIET You may resume your previous home diet once you are discharged from the hospital.  DRESSING / WOUND CARE / SHOWERING Keep your bulky bandage on for 2 days. On the third post-operative day you may remove the Ace bandage and gauze. There is a waterproof adhesive bandage on your skin which will stay in place until your first follow-up appointment. Once you remove this you will not need to place another bandage You may begin showering 3 days following surgery, but do not submerge the incision under water.  ACTIVITY For the first 5 days, the key is rest and control of pain and swelling Do your home exercises twice a day starting on post-operative day 3. On the days you go to physical therapy, just do the home exercises once that day. You should rest, ice and elevate the leg for 50 minutes out of every hour. Get up and walk/stretch for 10 minutes per hour. After 5 days you can increase your activity slowly as tolerated. Walk with your walker as instructed. Use the walker until you are comfortable transitioning to a cane. Walk with the cane in the opposite hand of the operative leg. You may discontinue the cane once you are comfortable and walking steadily. Avoid periods of inactivity such as sitting longer than an hour when not asleep. This helps prevent blood clots.  You may discontinue the knee immobilizer once you are able to perform a straight leg raise while lying down. You may resume a sexual relationship in one month   or when given the OK by your doctor.  You may return to work once you are cleared by your doctor.  Do not drive a car for 6 weeks or until released by your surgeon.  Do not drive while taking narcotics.  TED HOSE STOCKINGS Wear the elastic stockings on both legs for three weeks following surgery during the day. You may remove them at night for sleeping.  WEIGHT  BEARING Weight bearing as tolerated with assist device (walker, cane, etc) as directed, use it as long as suggested by your surgeon or therapist, typically at least 4-6 weeks.  POSTOPERATIVE CONSTIPATION PROTOCOL Constipation - defined medically as fewer than three stools per week and severe constipation as less than one stool per week.  One of the most common issues patients have following surgery is constipation.  Even if you have a regular bowel pattern at home, your normal regimen is likely to be disrupted due to multiple reasons following surgery.  Combination of anesthesia, postoperative narcotics, change in appetite and fluid intake all can affect your bowels.  In order to avoid complications following surgery, here are some recommendations in order to help you during your recovery period.  Colace (docusate) - Pick up an over-the-counter form of Colace or another stool softener and take twice a day as long as you are requiring postoperative pain medications.  Take with a full glass of water daily.  If you experience loose stools or diarrhea, hold the colace until you stool forms back up. If your symptoms do not get better within 1 week or if they get worse, check with your doctor. Dulcolax (bisacodyl) - Pick up over-the-counter and take as directed by the product packaging as needed to assist with the movement of your bowels.  Take with a full glass of water.  Use this product as needed if not relieved by Colace only.  MiraLax (polyethylene glycol) - Pick up over-the-counter to have on hand. MiraLax is a solution that will increase the amount of water in your bowels to assist with bowel movements.  Take as directed and can mix with a glass of water, juice, soda, coffee, or tea. Take if you go more than two days without a movement. Do not use MiraLax more than once per day. Call your doctor if you are still constipated or irregular after using this medication for 7 days in a row.  If you continue  to have problems with postoperative constipation, please contact the office for further assistance and recommendations.  If you experience "the worst abdominal pain ever" or develop nausea or vomiting, please contact the office immediatly for further recommendations for treatment.  ITCHING If you experience itching with your medications, try taking only a single pain pill, or even half a pain pill at a time.  You can also use Benadryl over the counter for itching or also to help with sleep.   MEDICATIONS See your medication summary on the "After Visit Summary" that the nursing staff will review with you prior to discharge.  You may have some home medications which will be placed on hold until you complete the course of blood thinner medication.  It is important for you to complete the blood thinner medication as prescribed by your surgeon.  Continue your approved medications as instructed at time of discharge.  PRECAUTIONS If you experience chest pain or shortness of breath - call 911 immediately for transfer to the hospital emergency department.  If you develop a fever greater that 101 F,   purulent drainage from wound, increased redness or drainage from wound, foul odor from the wound/dressing, or calf pain - CONTACT YOUR SURGEON.                                                   FOLLOW-UP APPOINTMENTS Make sure you keep all of your appointments after your operation with your surgeon and caregivers. You should call the office at the above phone number and make an appointment for approximately two weeks after the date of your surgery or on the date instructed by your surgeon outlined in the "After Visit Summary".  RANGE OF MOTION AND STRENGTHENING EXERCISES  Rehabilitation of the knee is important following a knee injury or an operation. After just a few days of immobilization, the muscles of the thigh which control the knee become weakened and shrink (atrophy). Knee exercises are designed to build up  the tone and strength of the thigh muscles and to improve knee motion. Often times heat used for twenty to thirty minutes before working out will loosen up your tissues and help with improving the range of motion but do not use heat for the first two weeks following surgery. These exercises can be done on a training (exercise) mat, on the floor, on a table or on a bed. Use what ever works the best and is most comfortable for you Knee exercises include:  Leg Lifts - While your knee is still immobilized in a splint or cast, you can do straight leg raises. Lift the leg to 60 degrees, hold for 3 sec, and slowly lower the leg. Repeat 10-20 times 2-3 times daily. Perform this exercise against resistance later as your knee gets better.  Quad and Hamstring Sets - Tighten up the muscle on the front of the thigh (Quad) and hold for 5-10 sec. Repeat this 10-20 times hourly. Hamstring sets are done by pushing the foot backward against an object and holding for 5-10 sec. Repeat as with quad sets.  Leg Slides: Lying on your back, slowly slide your foot toward your buttocks, bending your knee up off the floor (only go as far as is comfortable). Then slowly slide your foot back down until your leg is flat on the floor again. Angel Wings: Lying on your back spread your legs to the side as far apart as you can without causing discomfort.  A rehabilitation program following serious knee injuries can speed recovery and prevent re-injury in the future due to weakened muscles. Contact your doctor or a physical therapist for more information on knee rehabilitation.   POST-OPERATIVE OPIOID TAPER INSTRUCTIONS: It is important to wean off of your opioid medication as soon as possible. If you do not need pain medication after your surgery it is ok to stop day one. Opioids include: Codeine, Hydrocodone(Norco, Vicodin), Oxycodone(Percocet, oxycontin) and hydromorphone amongst others.  Long term and even short term use of opiods can  cause: Increased pain response Dependence Constipation Depression Respiratory depression And more.  Withdrawal symptoms can include Flu like symptoms Nausea, vomiting And more Techniques to manage these symptoms Hydrate well Eat regular healthy meals Stay active Use relaxation techniques(deep breathing, meditating, yoga) Do Not substitute Alcohol to help with tapering If you have been on opioids for less than two weeks and do not have pain than it is ok to stop all together.  Plan   to wean off of opioids This plan should start within one week post op of your joint replacement. Maintain the same interval or time between taking each dose and first decrease the dose.  Cut the total daily intake of opioids by one tablet each day Next start to increase the time between doses. The last dose that should be eliminated is the evening dose.   IF YOU ARE TRANSFERRED TO A SKILLED REHAB FACILITY If the patient is transferred to a skilled rehab facility following release from the hospital, a list of the current medications will be sent to the facility for the patient to continue.  When discharged from the skilled rehab facility, please have the facility set up the patient's Home Health Physical Therapy prior to being released. Also, the skilled facility will be responsible for providing the patient with their medications at time of release from the facility to include their pain medication, the muscle relaxants, and their blood thinner medication. If the patient is still at the rehab facility at time of the two week follow up appointment, the skilled rehab facility will also need to assist the patient in arranging follow up appointment in our office and any transportation needs.  MAKE SURE YOU:  Understand these instructions.  Get help right away if you are not doing well or get worse.   DENTAL ANTIBIOTICS:  In most cases prophylactic antibiotics for Dental procdeures after total joint surgery are  not necessary.  Exceptions are as follows:  1. History of prior total joint infection  2. Severely immunocompromised (Organ Transplant, cancer chemotherapy, Rheumatoid biologic meds such as Humera)  3. Poorly controlled diabetes (A1C &gt; 8.0, blood glucose over 200)  If you have one of these conditions, contact your surgeon for an antibiotic prescription, prior to your dental procedure.    Pick up stool softner and laxative for home use following surgery while on pain medications. Do not submerge incision under water. Please use good hand washing techniques while changing dressing each day. May shower starting three days after surgery. Please use a clean towel to pat the incision dry following showers. Continue to use ice for pain and swelling after surgery. Do not use any lotions or creams on the incision until instructed by your surgeon.  

## 2021-12-21 NOTE — Anesthesia Procedure Notes (Signed)
Anesthesia Regional Block: Adductor canal block   Pre-Anesthetic Checklist: , timeout performed,  Correct Patient, Correct Site, Correct Laterality,  Correct Procedure, Correct Position, site marked,  Risks and benefits discussed,  Surgical consent,  Pre-op evaluation,  At surgeon's request and post-op pain management  Laterality: Lower and Left  Prep: chloraprep       Needles:  Injection technique: Single-shot  Needle Type: Stimiplex     Needle Length: 9cm  Needle Gauge: 21     Additional Needles:   Procedures:,,,, ultrasound used (permanent image in chart),,    Narrative:  Start time: 12/21/2021 6:40 AM End time: 12/21/2021 7:00 AM Injection made incrementally with aspirations every 5 mL.  Performed by: Personally  Anesthesiologist: Nolon Nations, MD  Additional Notes: BP cuff, EKG monitors applied. Sedation begun. Artery and nerve location verified with ultrasound. Anesthetic injected incrementally (26ml), slowly, and after negative aspirations under direct u/s guidance. Good fascial/perineural spread. Tolerated well.

## 2021-12-21 NOTE — Transfer of Care (Signed)
Immediate Anesthesia Transfer of Care Note  Patient: Monica Mcintyre  Procedure(s) Performed: TOTAL KNEE ARTHROPLASTY (Left: Knee)  Patient Location: PACU  Anesthesia Type:MAC and Spinal  Level of Consciousness: awake, alert , oriented and patient cooperative  Airway & Oxygen Therapy: Patient Spontanous Breathing and Patient connected to face mask oxygen  Post-op Assessment: Report given to RN and Post -op Vital signs reviewed and stable  Post vital signs: Reviewed and stable  Last Vitals:  Vitals Value Taken Time  BP 100/52 12/21/21 0842  Temp    Pulse 56 12/21/21 0843  Resp 12 12/21/21 0843  SpO2 100 % 12/21/21 0843  Vitals shown include unvalidated device data.  Last Pain:  Vitals:   12/21/21 0547  TempSrc: Oral  PainSc:       Patients Stated Pain Goal: 3 (41/28/78 6767)  Complications: No notable events documented.

## 2021-12-21 NOTE — Op Note (Signed)
OPERATIVE REPORT-TOTAL KNEE ARTHROPLASTY   Pre-operative diagnosis- Osteoarthritis  Left knee(s)  Post-operative diagnosis- Osteoarthritis Left knee(s)  Procedure-  Left  Total Knee Arthroplasty  Surgeon- Dione Plover. Jadence Kinlaw, MD  Assistant- Theresa Duty, PA-C   Anesthesia-   Adductor canal block and spinal  EBL-50 mL   Drains None  Tourniquet time-  Total Tourniquet Time Documented: Thigh (Left) - 34 minutes Total: Thigh (Left) - 34 minutes     Complications- None  Condition-PACU - hemodynamically stable.   Brief Clinical Note  Monica Mcintyre is a 71 y.o. year old female with end stage OA of her left knee with progressively worsening pain and dysfunction. She has constant pain, with activity and at rest and significant functional deficits with difficulties even with ADLs. She has had extensive non-op management including analgesics, injections of cortisone, and home exercise program, but remains in significant pain with significant dysfunction. Radiographs show bone on bone arthritis medial and patellofemoral. She presents now for left Total Knee Arthroplasty.     Procedure in detail---   The patient is brought into the operating room and positioned supine on the operating table. After successful administration of  Adductor canal block and spinal,   a tourniquet is placed high on the  Left thigh(s) and the lower extremity is prepped and draped in the usual sterile fashion. Time out is performed by the operating team and then the  Left lower extremity is wrapped in Esmarch, knee flexed and the tourniquet inflated to 300 mmHg.       A midline incision is made with a ten blade through the subcutaneous tissue to the level of the extensor mechanism. A fresh blade is used to make a medial parapatellar arthrotomy. Soft tissue over the proximal medial tibia is subperiosteally elevated to the joint line with a knife and into the semimembranosus bursa with a Cobb elevator. Soft  tissue over the proximal lateral tibia is elevated with attention being paid to avoiding the patellar tendon on the tibial tubercle. The patella is everted, knee flexed 90 degrees and the ACL and PCL are removed. Findings are bone on bone all 3 compartments with massive global osteophytes.        The drill is used to create a starting hole in the distal femur and the canal is thoroughly irrigated with sterile saline to remove the fatty contents. The 5 degree Left  valgus alignment guide is placed into the femoral canal and the distal femoral cutting block is pinned to remove 9 mm off the distal femur. Resection is made with an oscillating saw.      The tibia is subluxed forward and the menisci are removed. The extramedullary alignment guide is placed referencing proximally at the medial aspect of the tibial tubercle and distally along the second metatarsal axis and tibial crest. The block is pinned to remove 57mm off the more deficient medial  side. Resection is made with an oscillating saw. Size 5is the most appropriate size for the tibia and the proximal tibia is prepared with the modular drill and keel punch for that size.      The femoral sizing guide is placed and size 6 is most appropriate. Rotation is marked off the epicondylar axis and confirmed by creating a rectangular flexion gap at 90 degrees. The size 6 cutting block is pinned in this rotation and the anterior, posterior and chamfer cuts are made with the oscillating saw. The intercondylar block is then placed and that cut is made.  Trial size 5 tibial component, trial size 6 narrow posterior stabilized femur and a 8  mm posterior stabilized rotating platform insert trial is placed. Full extension is achieved with excellent varus/valgus and anterior/posterior balance throughout full range of motion. The patella is everted and thickness measured to be 24  mm. Free hand resection is taken to 14 mm, a 38 template is placed, lug holes are drilled,  trial patella is placed, and it tracks normally. Osteophytes are removed off the posterior femur with the trial in place. All trials are removed and the cut bone surfaces prepared with pulsatile lavage. Cement is mixed and once ready for implantation, the size 5 tibial implant, size  6 narrow posterior stabilized femoral component, and the size 38 patella are cemented in place and the patella is held with the clamp. The trial insert is placed and the knee held in full extension. The Exparel (20 ml mixed with 60 ml saline) is injected into the extensor mechanism, posterior capsule, medial and lateral gutters and subcutaneous tissues.  All extruded cement is removed and once the cement is hard the permanent 8 mm posterior stabilized rotating platform insert is placed into the tibial tray.      The wound is copiously irrigated with saline solution and the extensor mechanism closed with # 0 Stratofix suture. The tourniquet is released for a total tourniquet time of 34  minutes. Flexion against gravity is 140 degrees and the patella tracks normally. Subcutaneous tissue is closed with 2.0 vicryl and subcuticular with running 4.0 Monocryl. The incision is cleaned and dried and steri-strips and a bulky sterile dressing are applied. The limb is placed into a knee immobilizer and the patient is awakened and transported to recovery in stable condition.      Please note that a surgical assistant was a medical necessity for this procedure in order to perform it in a safe and expeditious manner. Surgical assistant was necessary to retract the ligaments and vital neurovascular structures to prevent injury to them and also necessary for proper positioning of the limb to allow for anatomic placement of the prosthesis.   Dione Plover Monica Montone, MD    12/21/2021, 8:18 AM

## 2021-12-21 NOTE — Progress Notes (Signed)
Orthopedic Tech Progress Note Patient Details:  Monica Mcintyre 26-Oct-1951 [id number]  CPM Left Knee CPM Left Knee: On Left Knee Flexion (Degrees): 40 Left Knee Extension (Degrees): 10  Post Interventions Patient Tolerated: Well Instructions Provided: Care of device, Adjustment of device  [name] 12/21/2021, 8:38 AM

## 2021-12-22 ENCOUNTER — Encounter (HOSPITAL_COMMUNITY): Payer: Self-pay | Admitting: Orthopedic Surgery

## 2021-12-22 DIAGNOSIS — Z96652 Presence of left artificial knee joint: Secondary | ICD-10-CM | POA: Diagnosis not present

## 2021-12-22 DIAGNOSIS — Z79899 Other long term (current) drug therapy: Secondary | ICD-10-CM | POA: Diagnosis not present

## 2021-12-22 DIAGNOSIS — M1712 Unilateral primary osteoarthritis, left knee: Secondary | ICD-10-CM | POA: Diagnosis not present

## 2021-12-22 DIAGNOSIS — E039 Hypothyroidism, unspecified: Secondary | ICD-10-CM | POA: Diagnosis not present

## 2021-12-22 DIAGNOSIS — I10 Essential (primary) hypertension: Secondary | ICD-10-CM | POA: Diagnosis not present

## 2021-12-22 LAB — CBC
HCT: 30.2 % — ABNORMAL LOW (ref 36.0–46.0)
Hemoglobin: 9.7 g/dL — ABNORMAL LOW (ref 12.0–15.0)
MCH: 27.9 pg (ref 26.0–34.0)
MCHC: 32.1 g/dL (ref 30.0–36.0)
MCV: 86.8 fL (ref 80.0–100.0)
Platelets: 224 10*3/uL (ref 150–400)
RBC: 3.48 MIL/uL — ABNORMAL LOW (ref 3.87–5.11)
RDW: 14 % (ref 11.5–15.5)
WBC: 10.2 10*3/uL (ref 4.0–10.5)
nRBC: 0 % (ref 0.0–0.2)

## 2021-12-22 LAB — BASIC METABOLIC PANEL
Anion gap: 6 (ref 5–15)
BUN: 11 mg/dL (ref 8–23)
CO2: 24 mmol/L (ref 22–32)
Calcium: 8.3 mg/dL — ABNORMAL LOW (ref 8.9–10.3)
Chloride: 107 mmol/L (ref 98–111)
Creatinine, Ser: 0.75 mg/dL (ref 0.44–1.00)
GFR, Estimated: >60 mL/min (ref >60)
Glucose, Bld: 143 mg/dL — ABNORMAL HIGH (ref 70–99)
Potassium: 3.4 mmol/L — ABNORMAL LOW (ref 3.5–5.1)
Sodium: 137 mmol/L (ref 135–145)

## 2021-12-22 MED ORDER — METHOCARBAMOL 500 MG PO TABS: 500.0000 mg | ORAL_TABLET | Freq: Four times a day (QID) | ORAL | 0 refills | Status: AC | PRN

## 2021-12-22 MED ORDER — ASPIRIN 325 MG PO TBEC: 325.0000 mg | DELAYED_RELEASE_TABLET | Freq: Two times a day (BID) | ORAL | 0 refills | Status: AC

## 2021-12-22 MED ORDER — GABAPENTIN 300 MG PO CAPS
ORAL_CAPSULE | ORAL | 0 refills | Status: AC
Start: 2021-12-22 — End: ?

## 2021-12-22 MED ORDER — OXYCODONE HCL 5 MG PO TABS: 5.0000 mg | ORAL_TABLET | Freq: Four times a day (QID) | ORAL | 0 refills | Status: AC | PRN

## 2021-12-22 NOTE — TOC Transition Note (Signed)
Transition of Care [facility]) - CM/SW Discharge Note   Patient Details  Name: Monica Mcintyre MRN: [id number] Date of Birth: 1951-10-17  [facility]) CM/SW Contact:  Lennart Pall, LCSW Phone Number: 12/22/2021, 10:02 AM   Clinical Narrative:    Met briefly with pt and confirming rw to be delivered to room via [place].  Plan for OPPT at [facility].  No TOC needs.   Final next level of care: OP Rehab Barriers to Discharge: No Barriers Identified   Patient Goals and CMS Choice Patient states their goals for this hospitalization and ongoing recovery are:: return home      Discharge Placement                       Discharge Plan and Services                DME Arranged: Walker rolling DME Agency: [employer]       HH Arranged: NA [place] Agency: NA        Social Determinants of Health (SDOH) Interventions     Readmission Risk Interventions No flowsheet data found.

## 2021-12-22 NOTE — Progress Notes (Signed)
° °  Subjective: 1 Day Post-Op Procedure(s) (LRB): TOTAL KNEE ARTHROPLASTY (Left) Patient reports pain as mild.   Patient seen in rounds by Dr. Wynelle Link. Patient is well, and has had no acute complaints or problems No issues overnight. Denies chest pain, SOB, or calf pain. Foley catheter removed this AM.  We will continue therapy today, ambulated 67' yesterday.   Objective: Vital signs in last 24 hours: Temp:  [97.5 F (36.4 C)-98.1 F (36.7 C)] 98.1 F (36.7 C) (02/21 0614) Pulse Rate:  [51-62] 60 (02/21 0614) Resp:  [11-17] 16 (02/21 0614) BP: (100-141)/(52-70) 117/70 (02/21 0614) SpO2:  [96 %-100 %] 100 % (02/21 0614)  Intake/Output from previous day:  Intake/Output Summary (Last 24 hours) at 12/22/2021 0755 Last data filed at 12/22/2021 0700 Gross per 24 hour  Intake 2115.11 ml  Output 2750 ml  Net -634.89 ml     Intake/Output this shift: No intake/output data recorded.  Labs: Recent Labs    12/22/21 0320  HGB 9.7*   Recent Labs    12/22/21 0320  WBC 10.2  RBC 3.48*  HCT 30.2*  PLT 224   Recent Labs    12/22/21 0320  NA 137  K 3.4*  CL 107  CO2 24  BUN 11  CREATININE 0.75  GLUCOSE 143*  CALCIUM 8.3*   No results for input(s): LABPT, INR in the last 72 hours.  Exam: General - Patient is Alert and Oriented Extremity - Neurologically intact Neurovascular intact Sensation intact distally Dorsiflexion/Plantar flexion intact Dressing - dressing C/D/I Motor Function - intact, moving foot and toes well on exam.   Past Medical History:  Diagnosis Date   Arthritis    Complication of anesthesia    Depression    Diverticulosis    Hypertension    Hypothyroidism    Pre-diabetes    Thyroid disease     Assessment/Plan: 1 Day Post-Op Procedure(s) (LRB): TOTAL KNEE ARTHROPLASTY (Left) Principal Problem:   OA (osteoarthritis) of knee Active Problems:   Primary osteoarthritis of left knee  Estimated body mass index is 30.73 kg/m as calculated from  the following:   Height as of this encounter: 5\' 4"  (1.626 m).   Weight as of this encounter: 81.2 kg. Advance diet Up with therapy D/C IV fluids   Patient's anticipated LOS is less than 2 midnights, meeting these requirements: - Lives within 1 hour of care - Has a competent adult at home to recover with post-op recover - NO history of  - Chronic pain requiring opioids  - Diabetes  - Coronary Artery Disease  - Heart failure  - Heart attack  - Stroke  - DVT/VTE  - Cardiac arrhythmia  - Respiratory Failure/COPD  - Renal failure  - Anemia  - Advanced Liver disease  DVT Prophylaxis - Aspirin Weight bearing as tolerated. Continue therapy.  Plan is to go Home after hospital stay. Plan for discharge later today if progresses with therapy and meeting goals. Scheduled for OPPT at [facility]. Follow-up in the office in 2 weeks.  The PDMP database was reviewed today prior to any opioid medications being prescribed to this patient.  Theresa Duty, PA-C Orthopedic Surgery [phone] 12/22/2021, 7:55 AM

## 2021-12-22 NOTE — Progress Notes (Signed)
Physical Therapy Treatment Patient Details Name: Monica Mcintyre MRN: [id number] DOB: April 24, 1951 Today's Date: 12/22/2021   History of Present Illness 71 yo female s/p  L TKA. PMH: HTN, carpal tunnel    PT Comments    Pt progressing well. Reviewed gait and stair techniques. Ready for d/c with family assist as needed. Cautioned pt to continue use of RW for safety until OPPT advises otherwise.   Recommendations for follow up therapy are one component of a multi-disciplinary discharge planning process, led by the attending physician.  Recommendations may be updated based on patient status, additional functional criteria and insurance authorization.  Follow Up Recommendations  Follow physician's recommendations for discharge plan and follow up therapies     Assistance Recommended at Discharge Intermittent Supervision/Assistance  Patient can return home with the following Help with stairs or ramp for entrance;Assist for transportation   Equipment Recommendations  Rolling walker (2 wheels)    Recommendations for Other Services       Precautions / Restrictions Precautions Precautions: Fall;Knee Restrictions Weight Bearing Restrictions: No LLE Weight Bearing: Weight bearing as tolerated     Mobility  Bed Mobility Overal bed mobility: Modified Independent Bed Mobility: Supine to Sit     Supine to sit: Min guard     General bed mobility comments: for safety    Transfers Overall transfer level: Needs assistance Equipment used: Rolling walker (2 wheels) Transfers: Sit to/from Stand Sit to Stand: Supervision           General transfer comment: cues for hand placement and LLE positioning; overall safety    Ambulation/Gait Ambulation/Gait assistance: Supervision Gait Distance (Feet): 150 Feet Assistive device: Rolling walker (2 wheels) Gait Pattern/deviations: Step-to pattern, Step-through pattern       General Gait Details: cues for sequence, RW position,  placing incr  wt on LLE as tolerated. able to progress to step through   Stairs Stairs: Yes Stairs assistance: Supervision Stair Management: Two rails, One rail Right, Forwards Number of Stairs: 3 General stair comments: cues for sequence, superviison for safety   Wheelchair Mobility    Modified Rankin (Stroke Patients Only)       Balance                                            Cognition Arousal/Alertness: Awake/alert Behavior During Therapy: WFL for tasks assessed/performed Overall Cognitive Status: Within Functional Limits for tasks assessed                                          Exercises Reviewed handout for TKA exercises     General Comments        Pertinent Vitals/Pain Pain Assessment Pain Assessment: 0-10 Pain Score: 4  Pain Location: left knee Pain Descriptors / Indicators: Aching, Grimacing Pain Intervention(s): Limited activity within patient's tolerance, Monitored during session, Premedicated before session, Ice applied    Home Living                          Prior Function            PT Goals (current goals can now be found in the care plan section) Acute Rehab PT Goals Patient Stated Goal: get better PT Goal Formulation: With patient Time  For Goal Achievement: 12/28/21 Progress towards PT goals: Progressing toward goals    Frequency    7X/week      PT Plan Current plan remains appropriate    Co-evaluation              AM-PAC PT "6 Clicks" Mobility   Outcome Measure  Help needed turning from your back to your side while in a flat bed without using bedrails?: A Little Help needed moving from lying on your back to sitting on the side of a flat bed without using bedrails?: A Little Help needed moving to and from a bed to a chair (including a wheelchair)?: None Help needed standing up from a chair using your arms (e.g., wheelchair or bedside chair)?: A Little Help needed to  walk in hospital room?: A Little Help needed climbing 3-5 steps with a railing? : A Little 6 Click Score: 19    End of Session Equipment Utilized During Treatment: Gait belt Activity Tolerance: Patient tolerated treatment well Patient left: in chair;with call bell/phone within reach;with chair alarm set;with family/visitor present Nurse Communication: Mobility status PT Visit Diagnosis: Difficulty in walking, not elsewhere classified (R26.2)     Time: 8891-6945 PT Time Calculation (min) (ACUTE ONLY): 19 min  Charges:  $Gait Training: 8-22 mins                     Baxter Flattery, PT  Acute Rehab Dept ([place]) [phone] Pager [phone]  12/22/2021    [facility] 12/22/2021, 1:40 PM

## 2021-12-22 NOTE — Progress Notes (Signed)
Physical Therapy Treatment Patient Details Name: Monica Mcintyre MRN: [id number] DOB: [birth year] Today's Date: 12/22/2021   History of Present Illness 71 yo female s/p  L TKA. PMH: HTN, carpal tunnel    PT Comments    Pt progressing well. Pt would like to try steps later today even though she does not have steps to enter her dtr's home. will see once more for further gait and stair practice  Recommendations for follow up therapy are one component of a multi-disciplinary discharge planning process, led by the attending physician.  Recommendations may be updated based on patient status, additional functional criteria and insurance authorization.  Follow Up Recommendations  Follow physician's recommendations for discharge plan and follow up therapies     Assistance Recommended at Discharge Intermittent Supervision/Assistance  Patient can return home with the following Help with stairs or ramp for entrance;Assist for transportation   Equipment Recommendations  Rolling walker (2 wheels)    Recommendations for Other Services       Precautions / Restrictions Precautions Precautions: Fall;Knee Restrictions Weight Bearing Restrictions: No LLE Weight Bearing: Weight bearing as tolerated     Mobility  Bed Mobility Overal bed mobility: Modified Independent Bed Mobility: Supine to Sit     Supine to sit: Min guard     General bed mobility comments: for safety    Transfers Overall transfer level: Needs assistance Equipment used: Rolling walker (2 wheels) Transfers: Sit to/from Stand Sit to Stand: Supervision           General transfer comment: cues for hand placement and LLE positioning    Ambulation/Gait Ambulation/Gait assistance: Min guard, Supervision Gait Distance (Feet): 160 Feet Assistive device: Rolling walker (2 wheels) Gait Pattern/deviations: Step-to pattern, Step-through pattern       General Gait Details: cues for sequence, RW position, placing  incr  wt on LLE as tolerated. able to progress to step through   [employer]    Modified Rankin (Stroke Patients Only)       Balance                                            Cognition Arousal/Alertness: Awake/alert Behavior During Therapy: WFL for tasks assessed/performed Overall Cognitive Status: Within Functional Limits for tasks assessed                                          Exercises Total Joint Exercises Ankle Circles/Pumps: AROM, Both, 10 reps Quad Sets: AROM, Both, 10 reps Straight Leg Raises: 5 reps, AROM, Left Long Arc Quad: AROM, Left, 10 reps, Seated    General Comments        Pertinent Vitals/Pain Pain Assessment Pain Assessment: 0-10 Pain Score: 5  Pain Location: left knee Pain Descriptors / Indicators: Aching, Grimacing Pain Intervention(s): Monitored during session, Premedicated before session, Limited activity within patient's tolerance, Repositioned, Ice applied, Patient requesting pain meds-RN notified    Home Living                          Prior Function            PT Goals (current goals can now be found in the care  plan section) Acute Rehab PT Goals Patient Stated Goal: get better PT Goal Formulation: With patient Time For Goal Achievement: 12/28/21 Progress towards PT goals: Progressing toward goals    Frequency    7X/week      PT Plan Current plan remains appropriate    Co-evaluation              AM-PAC PT "6 Clicks" Mobility   Outcome Measure  Help needed turning from your back to your side while in a flat bed without using bedrails?: A Little Help needed moving from lying on your back to sitting on the side of a flat bed without using bedrails?: A Little Help needed moving to and from a bed to a chair (including a wheelchair)?: A Little Help needed standing up from a chair using your arms (e.g., wheelchair or bedside chair)?: A  Little Help needed to walk in hospital room?: A Little Help needed climbing 3-5 steps with a railing? : A Little 6 Click Score: 18    End of Session Equipment Utilized During Treatment: Gait belt Activity Tolerance: Patient tolerated treatment well Patient left: in chair;with call bell/phone within reach;with chair alarm set;with family/visitor present Nurse Communication: Mobility status PT Visit Diagnosis: Difficulty in walking, not elsewhere classified (R26.2)     Time: 0076-2263 PT Time Calculation (min) (ACUTE ONLY): 19 min  Charges:  $Gait Training: 8-22 mins                     Baxter Flattery, PT  Acute Rehab Dept ([place]) [phone] Pager [phone]  12/22/2021    [facility] 12/22/2021, 11:11 AM

## 2021-12-24 DIAGNOSIS — M25562 Pain in left knee: Secondary | ICD-10-CM | POA: Diagnosis not present

## 2021-12-28 DIAGNOSIS — M25562 Pain in left knee: Secondary | ICD-10-CM | POA: Diagnosis not present

## 2021-12-28 NOTE — Discharge Summary (Signed)
Patient ID: Monica Mcintyre MRN: [id number] DOB/AGE: 70-Jul-1952 70 y.o.  Admit date: 12/21/2021 Discharge date: 12/22/2021  Admission Diagnoses:  Principal Problem:   OA (osteoarthritis) of knee Active Problems:   Primary osteoarthritis of left knee   Discharge Diagnoses:  Same  Past Medical History:  Diagnosis Date   Arthritis    Complication of anesthesia    Depression    Diverticulosis    Hypertension    Hypothyroidism    Pre-diabetes    Thyroid disease     Surgeries: Procedure(s): TOTAL KNEE ARTHROPLASTY on 12/21/2021   Consultants:   Discharged Condition: Improved  Hospital Course: Monica Mcintyre is an 71 y.o. female who was admitted 12/21/2021 for operative treatment ofOA (osteoarthritis) of knee. Patient has severe unremitting pain that affects sleep, daily activities, and work/hobbies. After pre-op clearance the patient was taken to the operating room on 12/21/2021 and underwent  Procedure(s): TOTAL KNEE ARTHROPLASTY.    Patient was given perioperative antibiotics:  Anti-infectives (From admission, onward)    Start     Dose/Rate Route Frequency Ordered Stop   12/21/21 1300  ceFAZolin (ANCEF) IVPB 2g/100 mL premix        2 g 200 mL/hr over 30 Minutes Intravenous Every 6 hours 12/21/21 1126 12/21/21 2011   12/21/21 0600  ceFAZolin (ANCEF) IVPB 2g/100 mL premix        2 g 200 mL/hr over 30 Minutes Intravenous On call to O.R. 12/21/21 0518 12/21/21 0751        Patient was given sequential compression devices, early ambulation, and chemoprophylaxis to prevent DVT.  Patient benefited maximally from hospital stay and there were no complications.    Recent vital signs: No data found.   Recent laboratory studies: No results for input(s): WBC, HGB, HCT, PLT, NA, K, CL, CO2, BUN, CREATININE, GLUCOSE, INR, CALCIUM in the last 72 hours.  Invalid input(s): PT, 2   Discharge Medications:   Allergies as of 12/22/2021       Reactions   Codeine Nausea  And Vomiting        Medication List     STOP taking these medications    ibuprofen 200 MG tablet Commonly known as: ADVIL       TAKE these medications    aspirin 325 MG EC tablet Take 1 tablet (325 mg total) by mouth 2 (two) times daily for 20 days. Then take one 81 mg aspirin once a day for three weeks. Then discontinue aspirin.   B-12 5000 MCG Tbdp Take 5,000 mcg by mouth daily.   escitalopram 10 MG tablet Commonly known as: LEXAPRO Take 10 mg by mouth daily.   gabapentin 300 MG capsule Commonly known as: NEURONTIN Take a 300 mg capsule three times a day for two weeks following surgery.Then take a 300 mg capsule two times a day for two weeks. Then take a 300 mg capsule once a day for two weeks. Then discontinue.   Garlic 4098 MG Caps Take 1,000 mg by mouth daily.   levothyroxine 75 MCG tablet Commonly known as: SYNTHROID Take 75 mcg by mouth daily before breakfast.   Magnesium 100 MG Caps Take 1 capsule (100 mg total) by mouth daily. With Potassium 198 mg What changed:  medication strength how much to take   methocarbamol 500 MG tablet Commonly known as: ROBAXIN Take 1 tablet (500 mg total) by mouth every 6 (six) hours as needed for muscle spasms.   multivitamin tablet Take 1 tablet by mouth daily.   oxyCODONE  5 MG immediate release tablet Commonly known as: Oxy IR/ROXICODONE Take 1-2 tablets (5-10 mg total) by mouth every 6 (six) hours as needed for moderate pain or severe pain.   polyethylene glycol 17 g packet Commonly known as: MIRALAX / GLYCOLAX Take 17 g by mouth daily.   vitamin C 500 MG tablet Commonly known as: ASCORBIC ACID Take 500 mg by mouth daily.   Vitamin D 50 MCG (2000 UT) Caps Take 2,000 Units by mouth daily.   zinc gluconate 50 MG tablet Take 50 mg by mouth daily.               Discharge Care Instructions  (From admission, onward)           Start     Ordered   12/22/21 0000  Weight bearing as tolerated         12/22/21 0758   12/22/21 0000  Change dressing       Comments: You may remove the bulky bandage (ACE wrap and gauze) two days after surgery. You will have an adhesive waterproof bandage underneath. Leave this in place until your first follow-up appointment.   12/22/21 0758            Diagnostic Studies: No results found.  Disposition: Discharge disposition: 01-Home or Self Care       Discharge Instructions     Call MD / Call 911   Complete by: As directed    If you experience chest pain or shortness of breath, CALL 911 and be transported to the hospital emergency room.  If you develope a fever above 101 F, pus (white drainage) or increased drainage or redness at the wound, or calf pain, call your surgeon's office.   Change dressing   Complete by: As directed    You may remove the bulky bandage (ACE wrap and gauze) two days after surgery. You will have an adhesive waterproof bandage underneath. Leave this in place until your first follow-up appointment.   Constipation Prevention   Complete by: As directed    Drink plenty of fluids.  Prune juice may be helpful.  You may use a stool softener, such as Colace (over the counter) 100 mg twice a day.  Use MiraLax (over the counter) for constipation as needed.   Diet - low sodium heart healthy   Complete by: As directed    Do not put a pillow under the knee. Place it under the heel.   Complete by: As directed    Driving restrictions   Complete by: As directed    No driving for two weeks   Post-operative opioid taper instructions:   Complete by: As directed    POST-OPERATIVE OPIOID TAPER INSTRUCTIONS: It is important to wean off of your opioid medication as soon as possible. If you do not need pain medication after your surgery it is ok to stop day one. Opioids include: Codeine, Hydrocodone(Norco, Vicodin), Oxycodone(Percocet, oxycontin) and hydromorphone amongst others.  Long term and even short term use of opiods can  cause: Increased pain response Dependence Constipation Depression Respiratory depression And more.  Withdrawal symptoms can include Flu like symptoms Nausea, vomiting And more Techniques to manage these symptoms Hydrate well Eat regular healthy meals Stay active Use relaxation techniques(deep breathing, meditating, yoga) Do Not substitute Alcohol to help with tapering If you have been on opioids for less than two weeks and do not have pain than it is ok to stop all together.  Plan to wean off of opioids  This plan should start within one week post op of your joint replacement. Maintain the same interval or time between taking each dose and first decrease the dose.  Cut the total daily intake of opioids by one tablet each day Next start to increase the time between doses. The last dose that should be eliminated is the evening dose.      TED hose   Complete by: As directed    Use stockings (TED hose) for three weeks on both leg(s).  You may remove them at night for sleeping.   Weight bearing as tolerated   Complete by: As directed         Follow-up Information     Gaynelle Arabian, MD. Go on 01/07/2022.   Specialty: Orthopedic Surgery Why: You are scheduled for a follow up appointment on 01-07-22 at 10:00 am. Contact information: [address] [place] [address] [phone]                  Signed: Theresa Duty 12/28/2021, 11:35 AM

## 2021-12-30 DIAGNOSIS — M25562 Pain in left knee: Secondary | ICD-10-CM | POA: Diagnosis not present

## 2022-01-01 DIAGNOSIS — M25562 Pain in left knee: Secondary | ICD-10-CM | POA: Diagnosis not present

## 2022-01-04 DIAGNOSIS — M25562 Pain in left knee: Secondary | ICD-10-CM | POA: Diagnosis not present

## 2022-01-08 DIAGNOSIS — M25562 Pain in left knee: Secondary | ICD-10-CM | POA: Diagnosis not present

## 2022-01-12 DIAGNOSIS — M25562 Pain in left knee: Secondary | ICD-10-CM | POA: Diagnosis not present

## 2022-01-14 DIAGNOSIS — M25562 Pain in left knee: Secondary | ICD-10-CM | POA: Diagnosis not present

## 2022-01-19 DIAGNOSIS — M25562 Pain in left knee: Secondary | ICD-10-CM | POA: Diagnosis not present

## 2022-01-26 DIAGNOSIS — M25562 Pain in left knee: Secondary | ICD-10-CM | POA: Diagnosis not present

## 2022-01-28 DIAGNOSIS — Z471 Aftercare following joint replacement surgery: Secondary | ICD-10-CM | POA: Diagnosis not present

## 2022-02-02 DIAGNOSIS — Z01818 Encounter for other preprocedural examination: Secondary | ICD-10-CM | POA: Diagnosis not present

## 2022-02-02 DIAGNOSIS — R5383 Other fatigue: Secondary | ICD-10-CM | POA: Diagnosis not present

## 2022-02-09 DIAGNOSIS — M25562 Pain in left knee: Secondary | ICD-10-CM | POA: Diagnosis not present

## 2022-02-11 DIAGNOSIS — M25562 Pain in left knee: Secondary | ICD-10-CM | POA: Diagnosis not present

## 2022-02-15 DIAGNOSIS — M25562 Pain in left knee: Secondary | ICD-10-CM | POA: Diagnosis not present

## 2022-02-19 DIAGNOSIS — M25562 Pain in left knee: Secondary | ICD-10-CM | POA: Diagnosis not present

## 2022-02-23 DIAGNOSIS — M25562 Pain in left knee: Secondary | ICD-10-CM | POA: Diagnosis not present

## 2022-03-04 DIAGNOSIS — M25562 Pain in left knee: Secondary | ICD-10-CM | POA: Diagnosis not present

## 2022-03-18 DIAGNOSIS — Z23 Encounter for immunization: Secondary | ICD-10-CM | POA: Diagnosis not present

## 2022-03-18 DIAGNOSIS — I10 Essential (primary) hypertension: Secondary | ICD-10-CM | POA: Diagnosis not present

## 2022-03-18 DIAGNOSIS — M170 Bilateral primary osteoarthritis of knee: Secondary | ICD-10-CM | POA: Diagnosis not present

## 2022-03-18 DIAGNOSIS — R7309 Other abnormal glucose: Secondary | ICD-10-CM | POA: Diagnosis not present

## 2022-08-23 DIAGNOSIS — R7303 Prediabetes: Secondary | ICD-10-CM | POA: Diagnosis not present

## 2022-08-23 DIAGNOSIS — I10 Essential (primary) hypertension: Secondary | ICD-10-CM | POA: Diagnosis not present

## 2022-08-23 DIAGNOSIS — E785 Hyperlipidemia, unspecified: Secondary | ICD-10-CM | POA: Diagnosis not present

## 2022-08-23 DIAGNOSIS — R42 Dizziness and giddiness: Secondary | ICD-10-CM | POA: Diagnosis not present

## 2022-08-23 DIAGNOSIS — Z9889 Other specified postprocedural states: Secondary | ICD-10-CM | POA: Diagnosis not present

## 2022-08-23 DIAGNOSIS — Z96652 Presence of left artificial knee joint: Secondary | ICD-10-CM | POA: Diagnosis not present

## 2022-08-23 DIAGNOSIS — Z0001 Encounter for general adult medical examination with abnormal findings: Secondary | ICD-10-CM | POA: Diagnosis not present

## 2022-08-23 DIAGNOSIS — J309 Allergic rhinitis, unspecified: Secondary | ICD-10-CM | POA: Diagnosis not present

## 2022-08-23 DIAGNOSIS — E039 Hypothyroidism, unspecified: Secondary | ICD-10-CM | POA: Diagnosis not present

## 2022-10-21 ENCOUNTER — Other Ambulatory Visit: Payer: Self-pay | Admitting: Nurse Practitioner

## 2022-10-21 DIAGNOSIS — Z1231 Encounter for screening mammogram for malignant neoplasm of breast: Secondary | ICD-10-CM

## 2022-12-14 ENCOUNTER — Ambulatory Visit
Admission: RE | Admit: 2022-12-14 | Discharge: 2022-12-14 | Disposition: A | Discharge status: Home / Self Care | Payer: Medicare HMO | Source: Ambulatory Visit | Attending: Nurse Practitioner | Admitting: Nurse Practitioner

## 2022-12-14 DIAGNOSIS — Z1231 Encounter for screening mammogram for malignant neoplasm of breast: Secondary | ICD-10-CM

## 2023-04-03 IMAGING — MG MM DIGITAL SCREENING BILAT W/ TOMO AND CAD
8 series · 8 of 24 positions shown · non-contrast
Comparison: Previous exam(s).

CLINICAL DATA: Screening.

EXAM:
DIGITAL SCREENING BILATERAL MAMMOGRAM WITH TOMOSYNTHESIS AND CAD
TECHNIQUE: Bilateral screening digital craniocaudal and mediolateral oblique
mammograms were obtained. Bilateral screening digital breast
tomosynthesis was performed. The images were evaluated with
computer-aided detection.

[L CC synth-2D]
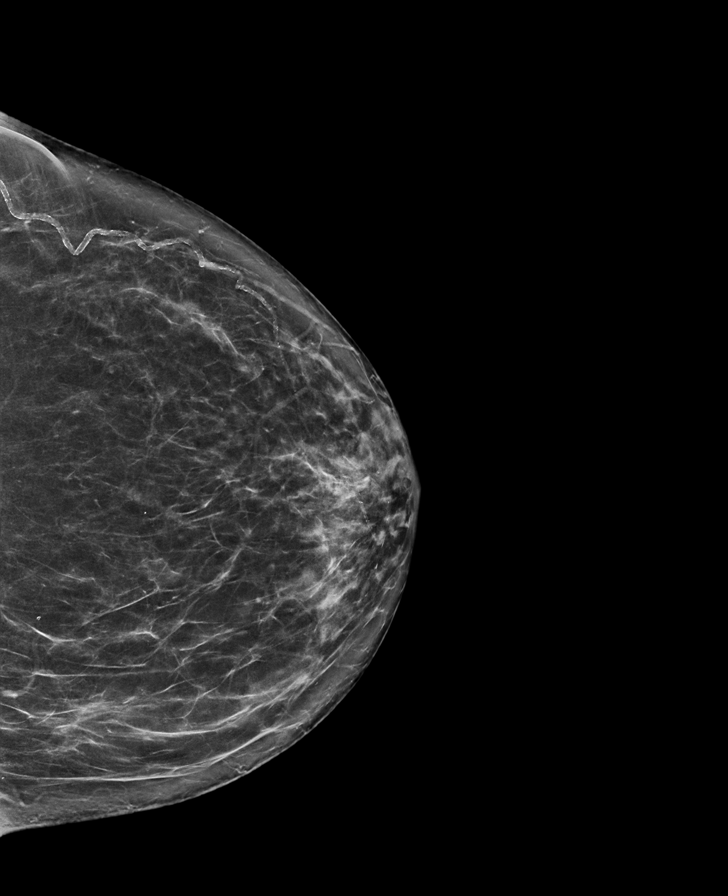

[L MLO synth-2D]
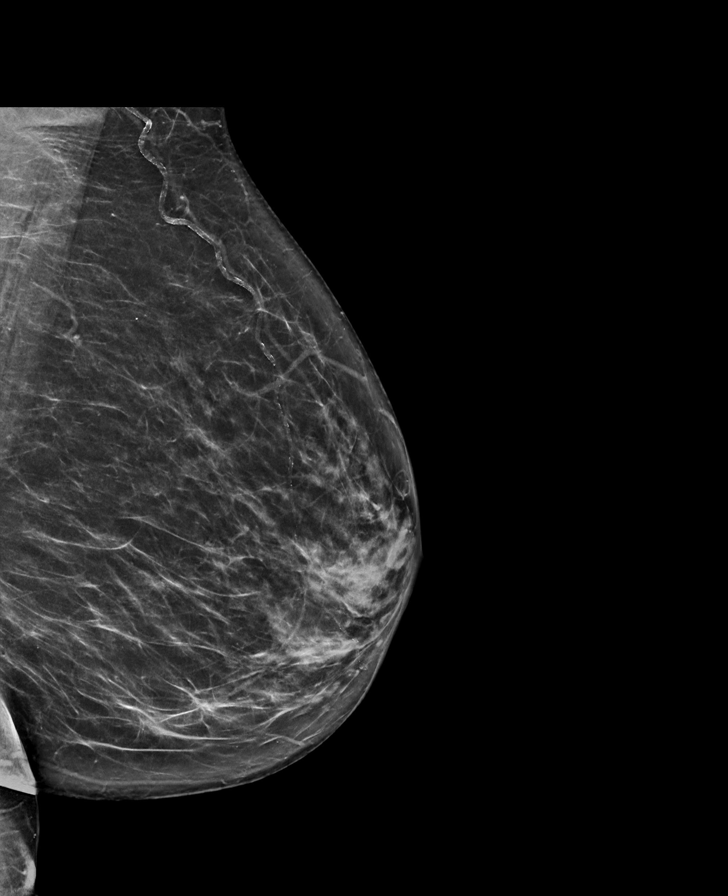

[R MLO synth-2D]
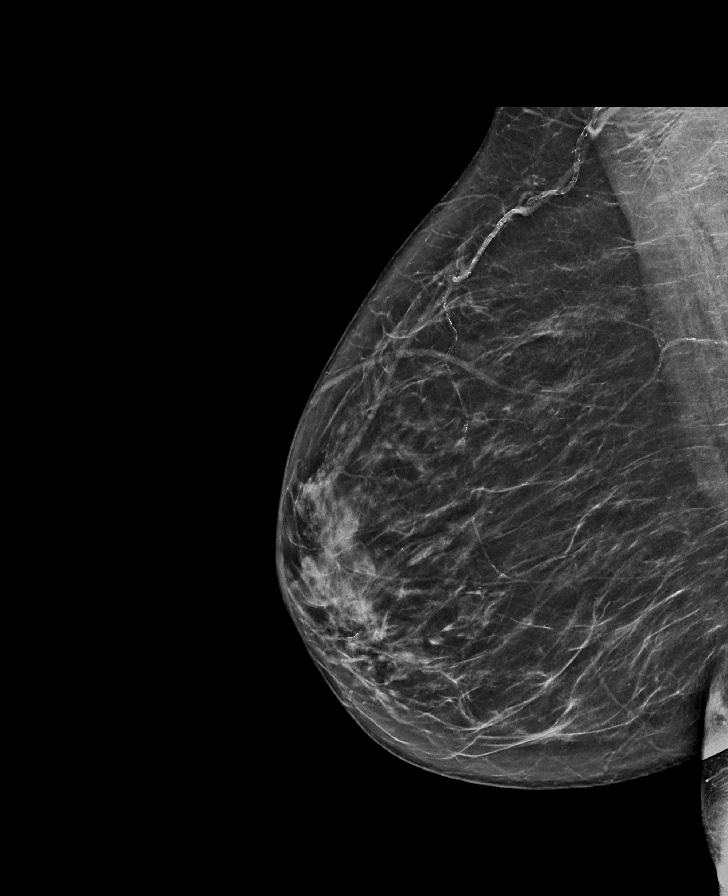

[R CC synth-2D]
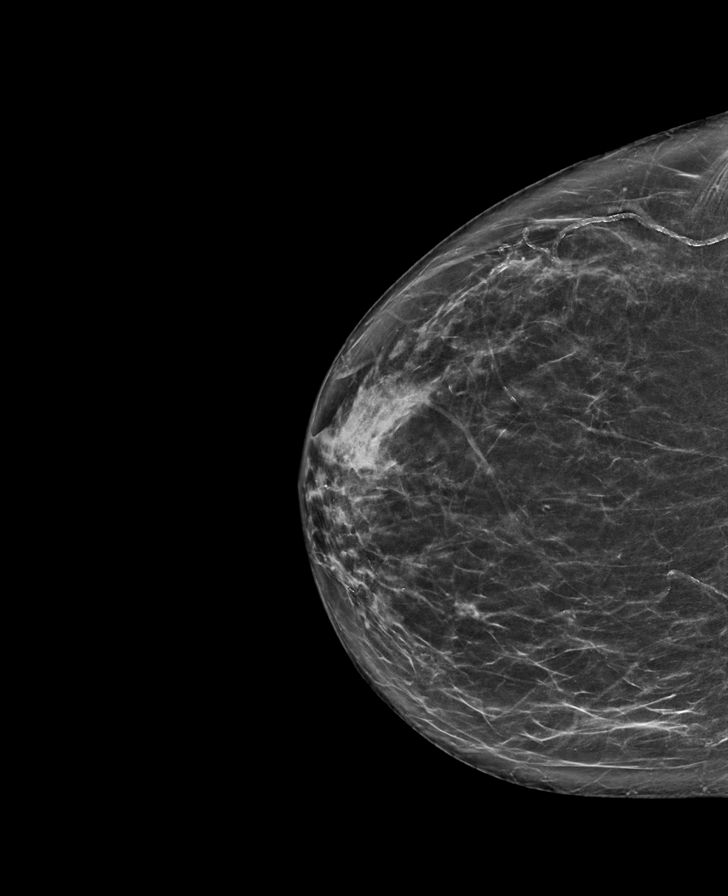

[L MLO tomo · tomo slice 37/73.0]
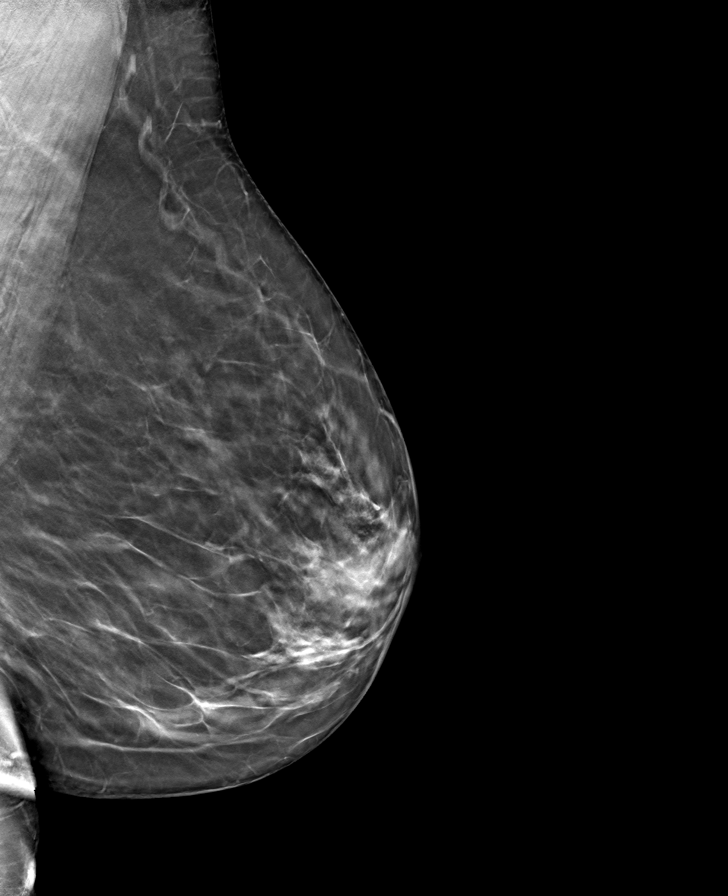

[R CC tomo · tomo slice 37/72.0]
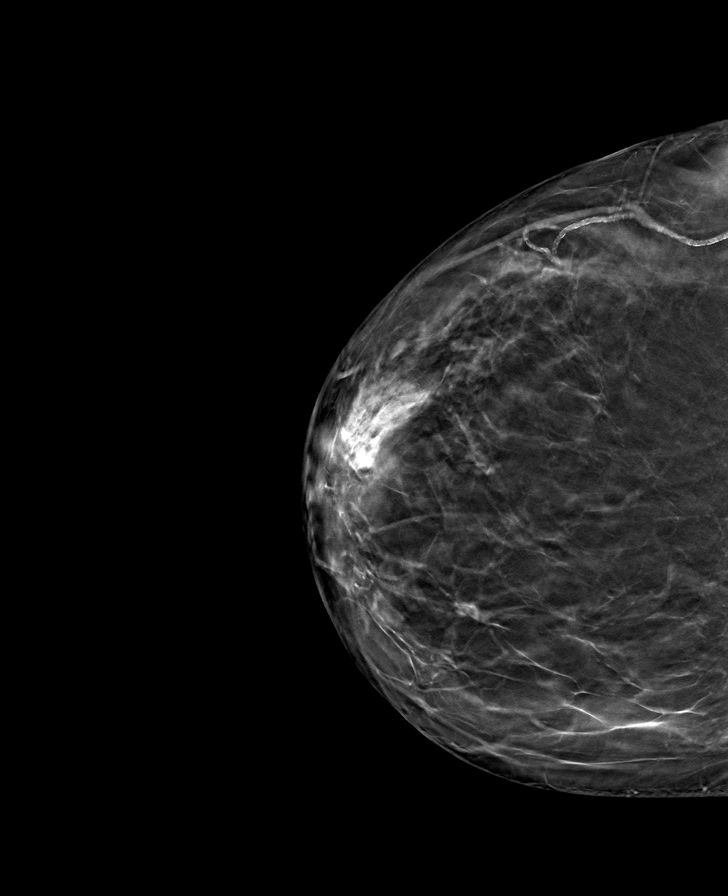

[R MLO tomo · tomo slice 37/74.0]
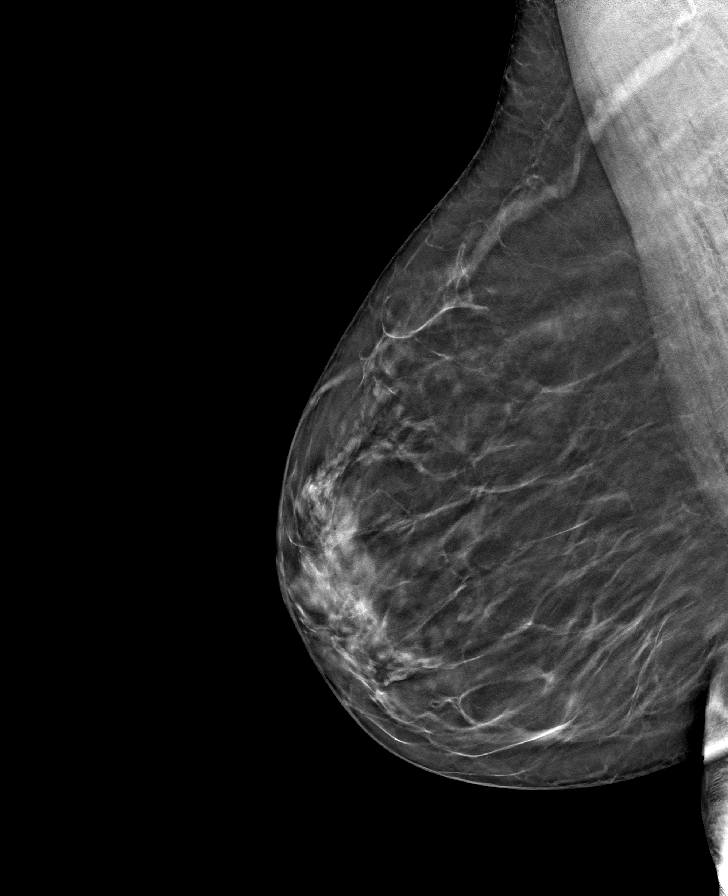

[L CC tomo · tomo slice 39/76.0]
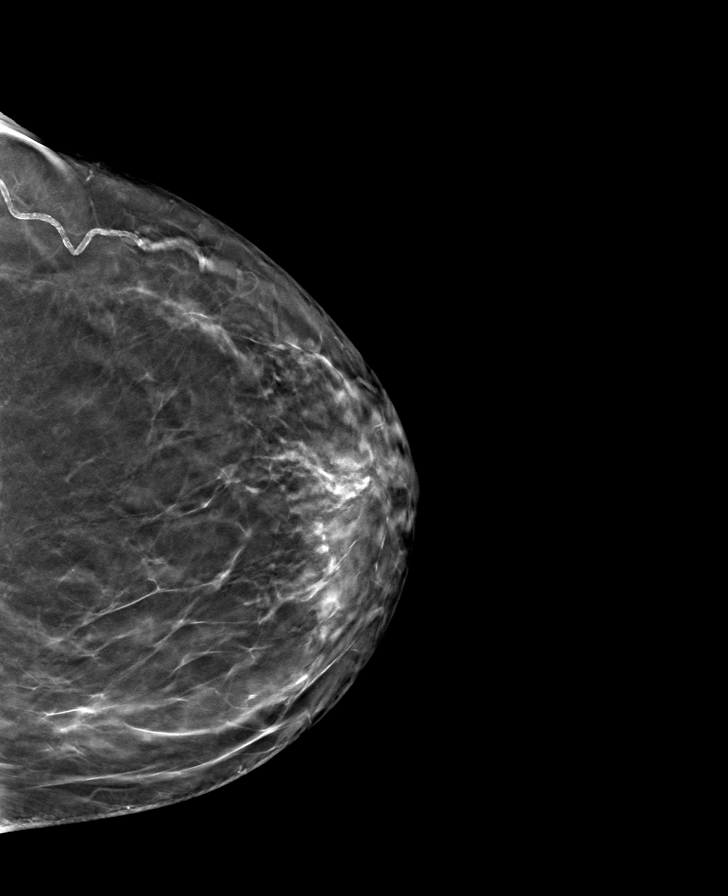

[8 of 24 positions shown; findings below may reference images not displayed]

ACR Breast Density Category b: There are scattered areas of
fibroglandular density.
FINDINGS: There are no findings suspicious for malignancy.
IMPRESSION: No mammographic evidence of malignancy. A result letter of this
screening mammogram will be mailed directly to the patient.

RECOMMENDATION:
Screening mammogram in one year. (Code:51-O-LD2)

BI-RADS CATEGORY  1: Negative.

## 2023-11-25 ENCOUNTER — Other Ambulatory Visit: Payer: Self-pay | Admitting: Internal Medicine

## 2023-11-25 DIAGNOSIS — Z1231 Encounter for screening mammogram for malignant neoplasm of breast: Secondary | ICD-10-CM

## 2023-12-22 ENCOUNTER — Ambulatory Visit: Payer: Medicare Other

## 2023-12-30 ENCOUNTER — Ambulatory Visit
Admission: RE | Admit: 2023-12-30 | Discharge: 2023-12-30 | Disposition: A | Discharge status: Home / Self Care | Payer: Medicare Other | Source: Ambulatory Visit | Attending: Internal Medicine | Admitting: Internal Medicine

## 2023-12-30 DIAGNOSIS — Z1231 Encounter for screening mammogram for malignant neoplasm of breast: Secondary | ICD-10-CM

## 2024-11-29 ENCOUNTER — Encounter: Payer: Self-pay | Admitting: Internal Medicine
